# Patient Record
Sex: Male | Born: 1945 | Race: Black or African American | Hispanic: No | Marital: Single
Health system: Southern US, Community
[De-identification: ages and names within clinical notes are randomized; demographics above are authoritative.]

## PROBLEM LIST (undated history)

## (undated) DIAGNOSIS — I10 Essential (primary) hypertension: Secondary | ICD-10-CM

---

## 2004-08-11 ENCOUNTER — Emergency Department (HOSPITAL_COMMUNITY): Admission: EM | Admit: 2004-08-11 | Discharge: 2004-08-11 | Payer: Self-pay | Admitting: Emergency Medicine

## 2006-05-15 ENCOUNTER — Ambulatory Visit: Payer: Self-pay | Admitting: Family Medicine

## 2006-06-25 ENCOUNTER — Encounter: Payer: Self-pay | Admitting: Family Medicine

## 2006-06-25 LAB — CONVERTED CEMR LAB
ALT: 39 units/L (ref 0–53)
AST: 35 units/L (ref 0–37)
Alkaline Phosphatase: 73 units/L (ref 39–117)
Basophils Relative: 1 % (ref 0–1)
Bilirubin, Direct: 0.2 mg/dL (ref 0.0–0.3)
CO2: 22 meq/L (ref 19–32)
Eosinophils Relative: 5 % (ref 0–5)
Glucose, Bld: 104 mg/dL — ABNORMAL HIGH (ref 70–99)
HCT: 43 % (ref 39.0–52.0)
Hemoglobin: 14.2 g/dL (ref 13.0–17.0)
Indirect Bilirubin: 0.8 mg/dL (ref 0.0–0.9)
LDL Cholesterol: 134 mg/dL — ABNORMAL HIGH (ref 0–99)
Lymphocytes Relative: 46 % (ref 12–46)
MCHC: 33 g/dL (ref 30.0–36.0)
MCV: 93.7 fL (ref 78.0–100.0)
Neutro Abs: 1.6 10*3/uL — ABNORMAL LOW (ref 1.7–7.7)
Neutrophils Relative %: 30 % — ABNORMAL LOW (ref 43–77)
PSA: 0.81 ng/mL (ref 0.10–4.00)
Potassium: 4.2 meq/L (ref 3.5–5.3)
Sodium: 142 meq/L (ref 135–145)
TSH: 0.811 microintl units/mL (ref 0.350–5.50)
Total Bilirubin: 1 mg/dL (ref 0.3–1.2)
WBC: 5.2 10*3/uL (ref 4.0–10.5)

## 2006-06-26 ENCOUNTER — Ambulatory Visit: Payer: Self-pay | Admitting: Family Medicine

## 2006-12-23 ENCOUNTER — Ambulatory Visit (HOSPITAL_COMMUNITY): Admission: RE | Admit: 2006-12-23 | Discharge: 2006-12-23 | Payer: Self-pay | Admitting: Family Medicine

## 2006-12-23 ENCOUNTER — Ambulatory Visit: Payer: Self-pay | Admitting: Family Medicine

## 2007-10-06 ENCOUNTER — Emergency Department (HOSPITAL_COMMUNITY): Admission: EM | Admit: 2007-10-06 | Discharge: 2007-10-06 | Payer: Self-pay | Admitting: Emergency Medicine

## 2008-06-01 ENCOUNTER — Ambulatory Visit: Payer: Self-pay | Admitting: Family Medicine

## 2008-06-01 DIAGNOSIS — I10 Essential (primary) hypertension: Secondary | ICD-10-CM | POA: Insufficient documentation

## 2008-06-01 DIAGNOSIS — R42 Dizziness and giddiness: Secondary | ICD-10-CM

## 2008-06-01 DIAGNOSIS — H547 Unspecified visual loss: Secondary | ICD-10-CM | POA: Insufficient documentation

## 2008-06-01 DIAGNOSIS — R519 Headache, unspecified: Secondary | ICD-10-CM | POA: Insufficient documentation

## 2008-06-01 DIAGNOSIS — R51 Headache: Secondary | ICD-10-CM

## 2008-06-01 DIAGNOSIS — M549 Dorsalgia, unspecified: Secondary | ICD-10-CM | POA: Insufficient documentation

## 2008-11-14 IMAGING — CR DG CHEST 1V PORT
1 series · 1 of 1 positions shown · non-contrast
Comparison: 12/23/2006

CLINICAL DATA: Chest pain

PORTABLE CHEST - 1 VIEW

[view not recorded]
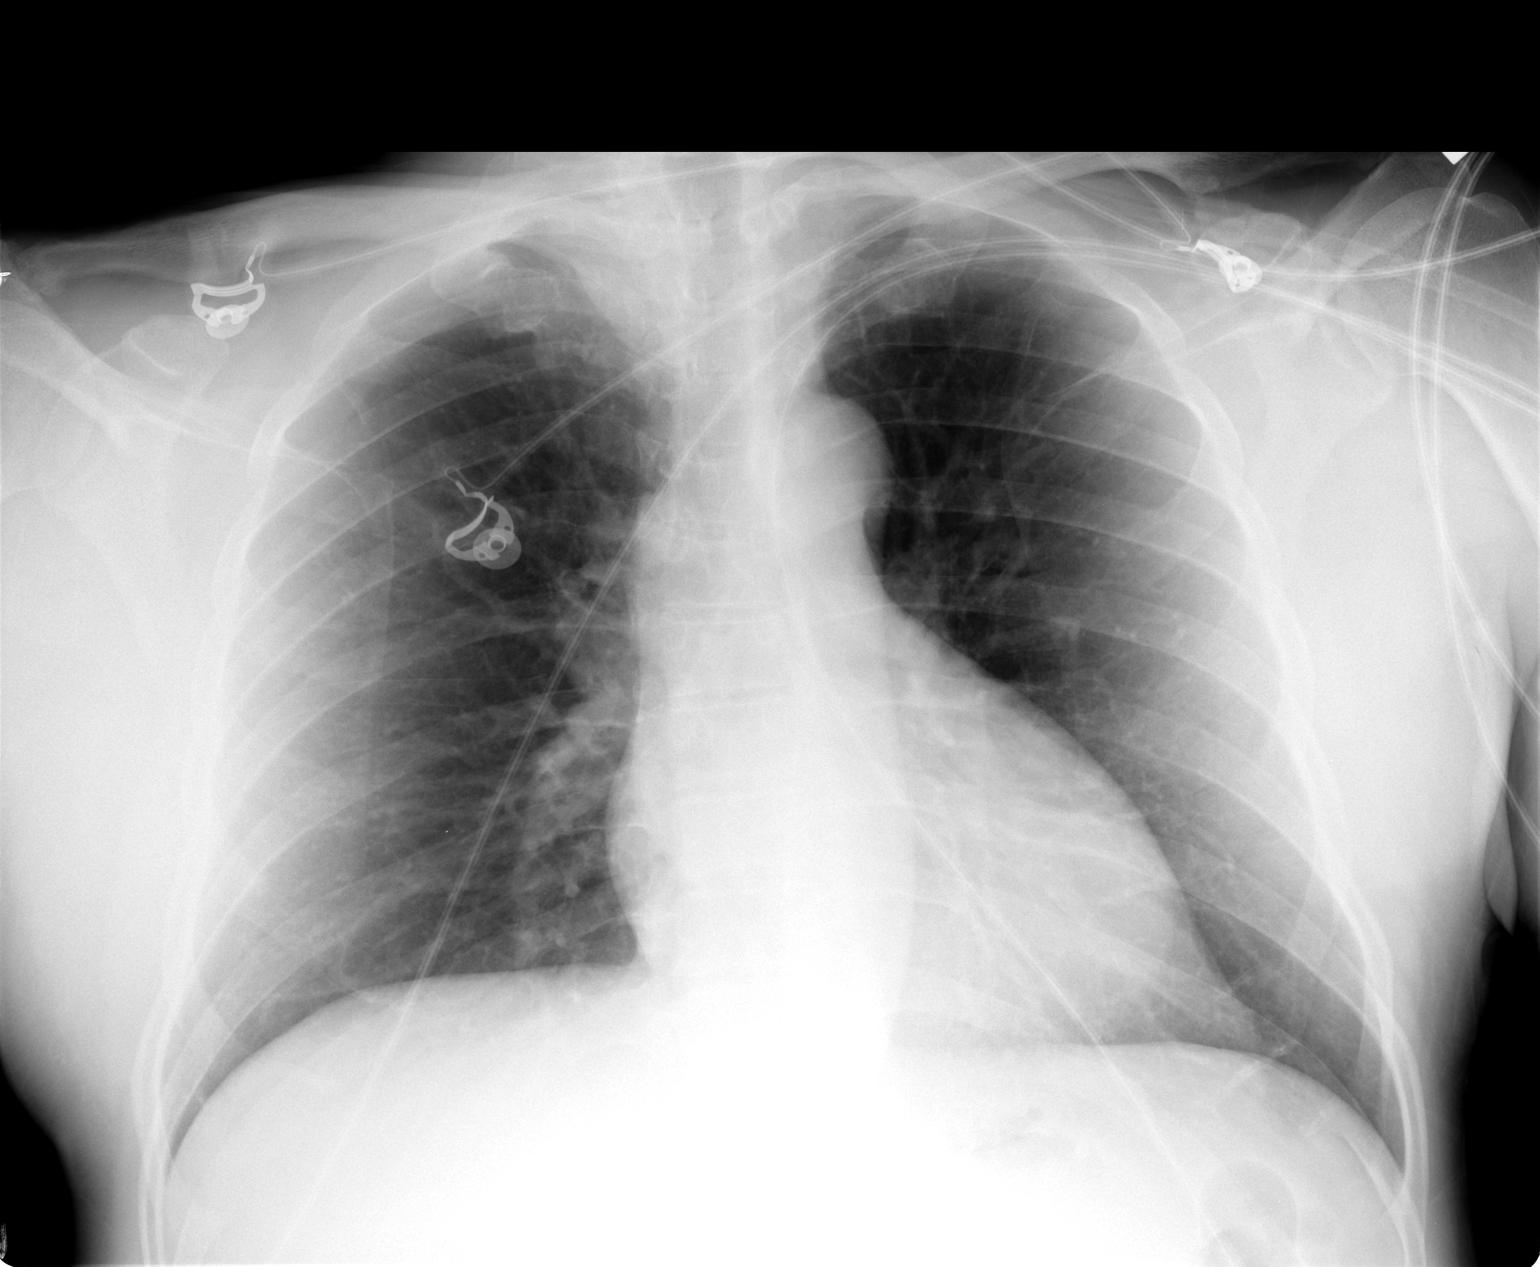

[1 of 1 positions shown; findings below may reference images not displayed]

FINDINGS: Heart and mediastinal contours are within normal limits.
No focal opacities or effusions.  No acute bony abnormality.
IMPRESSION: No active disease.

## 2008-12-22 ENCOUNTER — Ambulatory Visit: Payer: Self-pay | Admitting: Family Medicine

## 2008-12-23 ENCOUNTER — Encounter: Payer: Self-pay | Admitting: Family Medicine

## 2008-12-23 LAB — CONVERTED CEMR LAB
CO2: 26 meq/L (ref 19–32)
Chloride: 104 meq/L (ref 96–112)
Cholesterol: 223 mg/dL — ABNORMAL HIGH (ref 0–200)
HDL: 50 mg/dL (ref >39)
Total CHOL/HDL Ratio: 4.5
Triglycerides: 133 mg/dL (ref <150)
VLDL: 27 mg/dL (ref 0–40)

## 2008-12-26 LAB — CONVERTED CEMR LAB
Albumin: 4.5 g/dL (ref 3.5–5.2)
Bilirubin, Direct: 0.1 mg/dL (ref 0.0–0.3)
Total Protein: 7.6 g/dL (ref 6.0–8.3)

## 2008-12-29 ENCOUNTER — Encounter: Payer: Self-pay | Admitting: Family Medicine

## 2010-08-17 NOTE — H&P (Signed)
NAME:  Brad Kirk, Brad Kirk NO.:  [id number]   MEDICAL RECORD NO.:  [id number]          PATIENT TYPE:  AMB   LOCATION:  DAY                           FACILITY:  [facility]   PHYSICIAN:  Dalia Heading, M.D.  DATE OF BIRTH:  [birth year]   DATE OF ADMISSION:  DATE OF DISCHARGE:  LH                              HISTORY & PHYSICAL   CHIEF COMPLAINT:  Need for screening colonoscopy.   HISTORY OF PRESENT ILLNESS:  The patient is a 65 year old black male who  is referred for endoscopic evaluation.  Needs colonoscopy for screening  purposes.  No abdominal pain, weight loss, nausea, vomiting, diarrhea,  constipation, or melena.  Hematochezia has been noted.  He has never had  a colonoscopy.  There is no family history of colon carcinoma.   PAST MEDICAL HISTORY:  Hypertension.   PAST SURGICAL HISTORY:  Unremarkable.   CURRENT MEDICATIONS:  Blood pressure pill.   ALLERGIES:  No known drug allergies.   REVIEW OF SYSTEMS:  Noncontributory.   PHYSICAL EXAMINATION:  GENERAL:  The patient is a well-developed and  well-nourished black male in no acute distress.  LUNGS:  Clear to auscultation with equal breath sounds bilaterally.  HEART:  Regular rate and rhythm without S3, S4, or murmurs.  ABDOMEN:  Soft, nontender, and nondistended.  No hepatosplenomegaly or  masses are noted.  RECTAL:  Deferred due to the procedure.   IMPRESSION:  Need for screening colonoscopy.   PLAN:  The patient is scheduled for colonoscopy on June 17, 2008.  The  risks and benefits of the procedure including bleeding and perforation  were fully explained to the patient, who gave informed consent.      Dalia Heading, M.D.  Electronically Signed     MAJ/MEDQ  D:  06/14/2008  T:  06/15/2008  Job:  [id number]   cc:   Milus Mallick. Lodema Hong, M.D.  Fax: [phone]

## 2010-08-17 NOTE — H&P (Signed)
NAME:  Brad Kirk, Brad Kirk NO.:  [id number]   MEDICAL RECORD NO.:  [id number]          PATIENT TYPE:  AMB   LOCATION:  DAY                           FACILITY:  [facility]   PHYSICIAN:  Dalia Heading, M.D.  DATE OF BIRTH:  23-Aug-1945   DATE OF ADMISSION:  DATE OF DISCHARGE:  LH                              HISTORY & PHYSICAL   CHIEF COMPLAINT:  Need for screening colonoscopy.   HISTORY OF PRESENT ILLNESS:  The patient is a 65 year old black male who  is referred for endoscopic evaluation.  Needs colonoscopy for screening  purposes.  No abdominal pain, weight loss, nausea, vomiting, diarrhea,  constipation, or melena.  Hematochezia has been noted.  He has never had  a colonoscopy.  There is no family history of colon carcinoma.   PAST MEDICAL HISTORY:  Hypertension.   PAST SURGICAL HISTORY:  Unremarkable.   CURRENT MEDICATIONS:  Blood pressure pill.   ALLERGIES:  No known drug allergies.   REVIEW OF SYSTEMS:  Noncontributory.   PHYSICAL EXAMINATION:  GENERAL:  The patient is a well-developed and  well-nourished black male in no acute distress.  LUNGS:  Clear to auscultation with equal breath sounds bilaterally.  HEART:  Regular rate and rhythm without S3, S4, or murmurs.  ABDOMEN:  Soft, nontender, and nondistended.  No hepatosplenomegaly or  masses are noted.  RECTAL:  Deferred due to the procedure.   IMPRESSION:  Need for screening colonoscopy.   PLAN:  The patient is scheduled for colonoscopy on June 17, 2008.  The  risks and benefits of the procedure including bleeding and perforation  were fully explained to the patient, who gave informed consent.      Dalia Heading, M.D.     MAJ/MEDQ  D:  06/14/2008  T:  06/15/2008  Job:  [id number]   cc:   Milus Mallick. Lodema Hong, M.D.  Fax: [phone]

## 2010-12-27 LAB — CBC
Hemoglobin: 12.1 — ABNORMAL LOW
Platelets: 195
RBC: 3.92 — ABNORMAL LOW
RDW: 12.6

## 2010-12-27 LAB — DIFFERENTIAL
Basophils Absolute: 0
Eosinophils Relative: 6 — ABNORMAL HIGH
Lymphocytes Relative: 33
Monocytes Absolute: 0.5
Neutrophils Relative %: 51

## 2010-12-27 LAB — BASIC METABOLIC PANEL
CO2: 28
Calcium: 8.5
Chloride: 111
GFR calc Af Amer: >60

## 2010-12-27 LAB — POCT CARDIAC MARKERS: Operator id: 103621

## 2011-05-01 ENCOUNTER — Encounter: Payer: Self-pay | Admitting: Family Medicine

## 2011-05-01 ENCOUNTER — Ambulatory Visit: Payer: Self-pay | Admitting: Family Medicine

## 2011-05-02 ENCOUNTER — Encounter: Payer: Self-pay | Admitting: Family Medicine

## 2019-05-19 ENCOUNTER — Ambulatory Visit: Payer: Medicare Other | Attending: Internal Medicine

## 2019-05-19 DIAGNOSIS — Z23 Encounter for immunization: Secondary | ICD-10-CM | POA: Insufficient documentation

## 2019-05-19 NOTE — Progress Notes (Signed)
   Covid-19 Vaccination Clinic  Name:  ZAKHI DUPRE    MRN: [id number] DOB: Mar 21, 1946  05/19/2019  Mr. Harnois was observed post Covid-19 immunization for 15 minutes without incidence. He was provided with Vaccine Information Sheet and instruction to access the V-Safe system.   Mr. Gartman was instructed to call 911 with any severe reactions post vaccine: Marland Kitchen Difficulty breathing  . Swelling of your face and throat  . A fast heartbeat  . A bad rash all over your body  . Dizziness and weakness    Immunizations Administered    Name Date Dose VIS Date Route   Moderna COVID-19 Vaccine 05/19/2019 11:57 AM 0.5 mL 03/02/2019 Intramuscular   Manufacturer: Moderna   Lot: [id number]   NDC: 76184-859-27

## 2019-06-16 ENCOUNTER — Ambulatory Visit: Payer: Medicare Other | Attending: Internal Medicine

## 2019-06-16 DIAGNOSIS — Z23 Encounter for immunization: Secondary | ICD-10-CM

## 2019-06-16 NOTE — Progress Notes (Signed)
   Covid-19 Vaccination Clinic  Name:  SPIROS GREENFELD    MRN: [id number] DOB: [birth year]  06/16/2019  Mr. Fells was observed post Covid-19 immunization for 15 minutes without incident. He was provided with Vaccine Information Sheet and instruction to access the V-Safe system.   Mr. Baillie was instructed to call 911 with any severe reactions post vaccine: Marland Kitchen Difficulty breathing  . Swelling of face and throat  . A fast heartbeat  . A bad rash all over body  . Dizziness and weakness   Immunizations Administered    Name Date Dose VIS Date Route   Moderna COVID-19 Vaccine 06/16/2019 11:47 AM 0.5 mL 03/02/2019 Intramuscular   Manufacturer: Moderna   Lot: [id number]   NDC: 99242-683-41

## 2023-11-12 ENCOUNTER — Observation Stay (HOSPITAL_COMMUNITY)
Admission: EM | Admit: 2023-11-12 | Discharge: 2023-11-13 | Disposition: A | Discharge status: Home / Self Care | Attending: Internal Medicine | Admitting: Internal Medicine

## 2023-11-12 ENCOUNTER — Emergency Department (HOSPITAL_COMMUNITY)

## 2023-11-12 ENCOUNTER — Other Ambulatory Visit: Payer: Self-pay

## 2023-11-12 ENCOUNTER — Encounter (HOSPITAL_COMMUNITY): Payer: Self-pay

## 2023-11-12 DIAGNOSIS — I10 Essential (primary) hypertension: Secondary | ICD-10-CM | POA: Diagnosis not present

## 2023-11-12 DIAGNOSIS — I6523 Occlusion and stenosis of bilateral carotid arteries: Secondary | ICD-10-CM | POA: Insufficient documentation

## 2023-11-12 DIAGNOSIS — N1831 Chronic kidney disease, stage 3a: Secondary | ICD-10-CM | POA: Diagnosis not present

## 2023-11-12 DIAGNOSIS — R2689 Other abnormalities of gait and mobility: Secondary | ICD-10-CM | POA: Insufficient documentation

## 2023-11-12 DIAGNOSIS — R2681 Unsteadiness on feet: Secondary | ICD-10-CM | POA: Diagnosis not present

## 2023-11-12 DIAGNOSIS — M6281 Muscle weakness (generalized): Secondary | ICD-10-CM | POA: Insufficient documentation

## 2023-11-12 DIAGNOSIS — I639 Cerebral infarction, unspecified: Principal | ICD-10-CM | POA: Diagnosis present

## 2023-11-12 DIAGNOSIS — K219 Gastro-esophageal reflux disease without esophagitis: Secondary | ICD-10-CM | POA: Diagnosis not present

## 2023-11-12 DIAGNOSIS — Z7902 Long term (current) use of antithrombotics/antiplatelets: Secondary | ICD-10-CM | POA: Diagnosis not present

## 2023-11-12 DIAGNOSIS — Z79899 Other long term (current) drug therapy: Secondary | ICD-10-CM | POA: Insufficient documentation

## 2023-11-12 DIAGNOSIS — E785 Hyperlipidemia, unspecified: Secondary | ICD-10-CM | POA: Insufficient documentation

## 2023-11-12 DIAGNOSIS — I129 Hypertensive chronic kidney disease with stage 1 through stage 4 chronic kidney disease, or unspecified chronic kidney disease: Secondary | ICD-10-CM | POA: Diagnosis not present

## 2023-11-12 DIAGNOSIS — I6389 Other cerebral infarction: Secondary | ICD-10-CM | POA: Diagnosis not present

## 2023-11-12 DIAGNOSIS — I340 Nonrheumatic mitral (valve) insufficiency: Secondary | ICD-10-CM | POA: Diagnosis not present

## 2023-11-12 DIAGNOSIS — Z7982 Long term (current) use of aspirin: Secondary | ICD-10-CM | POA: Diagnosis not present

## 2023-11-12 DIAGNOSIS — I63511 Cerebral infarction due to unspecified occlusion or stenosis of right middle cerebral artery: Secondary | ICD-10-CM | POA: Diagnosis not present

## 2023-11-12 DIAGNOSIS — R531 Weakness: Secondary | ICD-10-CM | POA: Diagnosis present

## 2023-11-12 HISTORY — DX: Essential (primary) hypertension: I10

## 2023-11-12 LAB — CBC
HCT: 39.2 % (ref 39.0–52.0)
Hemoglobin: 12.8 g/dL — ABNORMAL LOW (ref 13.0–17.0)
MCH: 31.9 pg (ref 26.0–34.0)
MCHC: 32.7 g/dL (ref 30.0–36.0)
MCV: 97.8 fL (ref 80.0–100.0)
Platelets: 209 K/uL (ref 150–400)
RBC: 4.01 MIL/uL — ABNORMAL LOW (ref 4.22–5.81)
RDW: 12.3 % (ref 11.5–15.5)
WBC: 7.2 K/uL (ref 4.0–10.5)
nRBC: 0 % (ref 0.0–0.2)

## 2023-11-12 LAB — ETHANOL: Alcohol, Ethyl (B): <15 mg/dL (ref <15)

## 2023-11-12 LAB — PROTIME-INR
INR: 1 (ref 0.8–1.2)
Prothrombin Time: 14.1 s (ref 11.4–15.2)

## 2023-11-12 LAB — COMPREHENSIVE METABOLIC PANEL WITH GFR
ALT: 13 U/L (ref 0–44)
AST: 21 U/L (ref 15–41)
Albumin: 4 g/dL (ref 3.5–5.0)
Alkaline Phosphatase: 78 U/L (ref 38–126)
Anion gap: 10 (ref 5–15)
BUN: 14 mg/dL (ref 8–23)
CO2: 24 mmol/L (ref 22–32)
Calcium: 9.3 mg/dL (ref 8.9–10.3)
Chloride: 107 mmol/L (ref 98–111)
Creatinine, Ser: 1.33 mg/dL — ABNORMAL HIGH (ref 0.61–1.24)
GFR, Estimated: 56 mL/min — ABNORMAL LOW (ref >60)
Glucose, Bld: 134 mg/dL — ABNORMAL HIGH (ref 70–99)
Potassium: 3.3 mmol/L — ABNORMAL LOW (ref 3.5–5.1)
Sodium: 141 mmol/L (ref 135–145)
Total Bilirubin: 0.6 mg/dL (ref 0.0–1.2)
Total Protein: 8 g/dL (ref 6.5–8.1)

## 2023-11-12 LAB — DIFFERENTIAL
Abs Immature Granulocytes: 0.02 K/uL (ref 0.00–0.07)
Basophils Absolute: 0 K/uL (ref 0.0–0.1)
Basophils Relative: 0 %
Eosinophils Absolute: 0.1 K/uL (ref 0.0–0.5)
Eosinophils Relative: 2 %
Immature Granulocytes: 0 %
Lymphocytes Relative: 31 %
Lymphs Abs: 2.2 K/uL (ref 0.7–4.0)
Monocytes Absolute: 0.5 K/uL (ref 0.1–1.0)
Monocytes Relative: 6 %
Neutro Abs: 4.4 K/uL (ref 1.7–7.7)
Neutrophils Relative %: 61 %

## 2023-11-12 LAB — CBG MONITORING, ED: Glucose-Capillary: 143 mg/dL — ABNORMAL HIGH (ref 70–99)

## 2023-11-12 LAB — APTT: aPTT: 25 s (ref 24–36)

## 2023-11-12 MED ORDER — ASPIRIN 81 MG PO CHEW
324.0000 mg | CHEWABLE_TABLET | Freq: Once | ORAL | Status: AC
  Administered 2023-11-12 (×2): 324 mg via ORAL
  Filled 2023-11-12: qty 4

## 2023-11-12 MED ORDER — IOHEXOL 350 MG/ML SOLN
100.0000 mL | Freq: Once | INTRAVENOUS | Status: AC | PRN
  Administered 2023-11-12 (×2): 100 mL via INTRAVENOUS

## 2023-11-12 NOTE — ED Provider Notes (Signed)
 [place] EMERGENCY DEPARTMENT AT [facility] Provider Note   CSN: [id number] Arrival date & time: 11/12/23  2200  An emergency department physician performed an initial assessment on this suspected stroke patient at 2229.  Patient presents with: Weakness   Brad Kirk is a 78 y.o. male.    Weakness  Patient is a 78 year old male with a history of untreated hypertension although has been diagnosed with it in the past.  He does not have diabetes, no history of cardiac disease or prior stroke.  He states that he went to sit down on the couch at 7:00 tonight to watch TV and had no complaints.  Initially he reported that about 30 minutes ago he noticed that his left arm started tingling and did not feel right.  He could not use it.  When I ask him about the timing he states will maybe it was an hour and a half ago.  He definitely thinks that it was normal at 7:00.  He denies headache, chest pain, shortness of breath or any other complaints.  He is noted to be severely hypertensive on arrival at 180/87.    Prior to Admission medications   Not on File    Allergies: Patient has no known allergies.    Review of Systems  Neurological:  Positive for weakness.  All other systems reviewed and are negative.   Updated Vital Signs BP (!) 172/86   Pulse 78   Temp 98.3 F (36.8 C) (Oral)   Resp 18   Ht 1.778 m (5' 10)   Wt 81.6 kg   SpO2 95%   BMI 25.83 kg/m   Physical Exam Vitals and nursing note reviewed.  Constitutional:      General: He is not in acute distress.    Appearance: He is well-developed.  HENT:     Head: Normocephalic and atraumatic.     Mouth/Throat:     Pharynx: No oropharyngeal exudate.  Eyes:     General: No scleral icterus.       Right eye: No discharge.        Left eye: No discharge.     Conjunctiva/sclera: Conjunctivae normal.     Pupils: Pupils are equal, round, and reactive to light.  Neck:     Thyroid: No thyromegaly.     Vascular:  No JVD.  Cardiovascular:     Rate and Rhythm: Normal rate and regular rhythm.     Heart sounds: Normal heart sounds. No murmur heard.    No friction rub. No gallop.  Pulmonary:     Effort: Pulmonary effort is normal. No respiratory distress.     Breath sounds: Normal breath sounds. No wheezing or rales.  Abdominal:     General: Bowel sounds are normal. There is no distension.     Palpations: Abdomen is soft. There is no mass.     Tenderness: There is no abdominal tenderness.  Musculoskeletal:        General: No tenderness. Normal range of motion.     Cervical back: Normal range of motion and neck supple.  Lymphadenopathy:     Cervical: No cervical adenopathy.  Skin:    General: Skin is warm and dry.     Findings: No erythema or rash.  Neurological:     Mental Status: He is alert.     Coordination: Coordination normal.     Comments: The patient has no facial asymmetry, his speech is normal, unfortunately he has fairly dense weakness of  his left upper extremity, he is able to barely lift the arm off the bed, he has dysmetria has no grip and has decree sensation of the left arm, his left leg has decree sensation and he has weakness of the left leg compared to the right.  Normal gross visual acuity, cranial nerves III through XII otherwise normal  Psychiatric:        Behavior: Behavior normal.     (all labs ordered are listed, but only abnormal results are displayed) Labs Reviewed  CBC - Abnormal; Notable for the following components:      Result Value   RBC 4.01 (*)    Hemoglobin 12.8 (*)    All other components within normal limits  COMPREHENSIVE METABOLIC PANEL WITH GFR - Abnormal; Notable for the following components:   Potassium 3.3 (*)    Glucose, Bld 134 (*)    Creatinine, Ser 1.33 (*)    GFR, Estimated 56 (*)    All other components within normal limits  CBG MONITORING, ED - Abnormal; Notable for the following components:   Glucose-Capillary 143 (*)    All other  components within normal limits  ETHANOL  PROTIME-INR  APTT  DIFFERENTIAL  RAPID URINE DRUG SCREEN, HOSP PERFORMED  I-STAT CHEM 8, ED    EKG: EKG Interpretation Date/Time:  Wednesday November 12 2023 22:26:52 EDT Ventricular Rate:  84 PR Interval:  150 QRS Duration:  107 QT Interval:  385 QTC Calculation: 456 R Axis:   19  Text Interpretation: Sinus rhythm Borderline repolarization abnormality Confirmed by Cleotilde Rogue ([id number]) on 11/12/2023 11:07:33 PM  Radiology: CT HEAD CODE STROKE WO CONTRAST Result Date: 11/12/2023 CLINICAL DATA:  Initial evaluation for acute stroke. EXAM: CT HEAD WITHOUT CONTRAST CT ANGIOGRAPHY HEAD AND NECK CT PERFUSION BRAIN TECHNIQUE: Multidetector CT imaging of the head and neck was performed using the standard protocol during bolus administration of intravenous contrast. Multiplanar CT image reconstructions and MIPs were obtained to evaluate the vascular anatomy. Carotid stenosis measurements (when applicable) are obtained utilizing NASCET criteria, using the distal internal carotid diameter as the denominator. Multiphase CT imaging of the brain was performed following IV bolus contrast injection. Subsequent parametric perfusion maps were calculated using RAPID software. RADIATION DOSE REDUCTION: This exam was performed according to the departmental dose-optimization program which includes automated exposure control, adjustment of the mA and/or kV according to patient size and/or use of iterative reconstruction technique. CONTRAST:  OMNIPAQUE  IOHEXOL  350 MG/ML SOLN COMPARISON:  Prior study from 08/12/2004 FINDINGS: CT HEAD FINDINGS Brain: Cerebral volume within normal limits for age. Subtle loss of gray-white matter differentiation seen involving the high right frontal parietal region, concerning for an evolving acute right MCA territory infarct. No acute intracranial hemorrhage. No other acute large vessel territory infarct. No mass lesion or midline shift. No  hydrocephalus or extra-axial fluid collection. Vascular: No abnormal hyperdense vessel. Calcified atherosclerosis present about the carotid siphons. Skull: Scalp soft tissues demonstrate no acute finding. Calvarium intact. Sinuses/Orbits: Globes orbital soft tissues within normal limits. Paranasal sinuses and mastoid air cells are largely clear. Other: None. ASPECTS (Alberta Stroke Program Early CT Score) - Ganglionic level infarction (caudate, lentiform nuclei, internal capsule, insula, M1-M3 cortex): 7 - Supraganglionic infarction (M4-M6 cortex): 1 Total score (0-10 with 10 being normal): 8 Review of the MIP images confirms the above findings CTA NECK FINDINGS Aortic arch: Standard branching. Imaged portion shows no evidence of aneurysm or dissection. No significant stenosis of the major arch vessel origins. Right carotid system: No  evidence of dissection, stenosis (50% or greater) or occlusion. Left carotid system: No evidence of dissection, stenosis (50% or greater) or occlusion. Vertebral arteries: No evidence of dissection, stenosis (50% or greater) or occlusion. Skeleton: No worrisome osseous lesions. Moderate spondylosis at C6-7 and C7-T1. Other neck: No other acute finding. Upper chest: No other acute finding. Review of the MIP images confirms the above findings CTA HEAD FINDINGS Anterior circulation: Atheromatous irregularity about the carotid siphons bilaterally. Resultant moderate stenosis at the supraclinoid left ICA. On the right, calcified plaque with superimposed intraluminal filling defect present at the right ICA terminus, suspicious for thrombus (series 6, image 103). Resultant severe high-grade stenosis at this level. Underlying atheromatous plaque at this location is likely contributory. A1 segments patent bilaterally. Normal anterior communicating artery complex. Anterior cerebral arteries patent without significant stenosis. No M1 stenosis or occlusion. No proximal MCA branch occlusion. Distal  MCA branches perfused and symmetric. No visible downstream complication. Posterior circulation: Atheromatous change about the dominant right V4 segment without stenosis. Left V4 segment patent. Neither PICA origin well visualized. Basilar patent without stenosis. Superior cerebellar and posterior cerebral arteries patent bilaterally. Venous sinuses: Patent allowing for timing the contrast bolus. Anatomic variants: None significant.  No aneurysm. Review of the MIP images confirms the above findings CT Brain Perfusion Findings: ASPECTS: 8 CBF (<30%) Volume: 4mL Perfusion (Tmax>6.0s) volume: 5mL Mismatch Volume: 1mL Infarction Location:Acute core infarct present at the right parietal convexity, consistent with an acute right MCA territory infarct. No significant surrounding ischemic penumbra by CT perfusion. IMPRESSION: CT HEAD: 1. Subtle loss of gray-white matter differentiation involving the high right frontoparietal region, concerning for an evolving acute right MCA territory infarct. No intracranial hemorrhage. 2. Aspects = 8. CTA HEAD AND NECK: 1. Intraluminal filling defect at the right ICA terminus, suspicious for thrombus. Resultant severe high-grade stenosis at this level. 2. Moderate atheromatous stenosis at the supraclinoid left ICA. 3. No hemodynamically significant stenosis within the neck. CT PERFUSION: 4 cc acute core infarct at the right parietal convexity, consistent with an acute right MCA territory infarct. No significant surrounding ischemic penumbra by CT perfusion. Critical Value/emergent results were called by telephone at the time of interpretation on 11/12/2023 at 10:45 p.m. to provider [facility] , who verbally acknowledged these results. Electronically Signed   By: Morene Hoard M.D.   On: 11/12/2023 23:15   CT ANGIO HEAD NECK W WO CM W PERF (CODE STROKE) Result Date: 11/12/2023 CLINICAL DATA:  Initial evaluation for acute stroke. EXAM: CT HEAD WITHOUT CONTRAST CT ANGIOGRAPHY HEAD  AND NECK CT PERFUSION BRAIN TECHNIQUE: Multidetector CT imaging of the head and neck was performed using the standard protocol during bolus administration of intravenous contrast. Multiplanar CT image reconstructions and MIPs were obtained to evaluate the vascular anatomy. Carotid stenosis measurements (when applicable) are obtained utilizing NASCET criteria, using the distal internal carotid diameter as the denominator. Multiphase CT imaging of the brain was performed following IV bolus contrast injection. Subsequent parametric perfusion maps were calculated using RAPID software. RADIATION DOSE REDUCTION: This exam was performed according to the departmental dose-optimization program which includes automated exposure control, adjustment of the mA and/or kV according to patient size and/or use of iterative reconstruction technique. CONTRAST:  OMNIPAQUE  IOHEXOL  350 MG/ML SOLN COMPARISON:  Prior study from 08/12/2004 FINDINGS: CT HEAD FINDINGS Brain: Cerebral volume within normal limits for age. Subtle loss of gray-white matter differentiation seen involving the high right frontal parietal region, concerning for an evolving acute right MCA territory infarct.  No acute intracranial hemorrhage. No other acute large vessel territory infarct. No mass lesion or midline shift. No hydrocephalus or extra-axial fluid collection. Vascular: No abnormal hyperdense vessel. Calcified atherosclerosis present about the carotid siphons. Skull: Scalp soft tissues demonstrate no acute finding. Calvarium intact. Sinuses/Orbits: Globes orbital soft tissues within normal limits. Paranasal sinuses and mastoid air cells are largely clear. Other: None. ASPECTS ([place] Stroke Program Early CT Score) - Ganglionic level infarction (caudate, lentiform nuclei, internal capsule, insula, M1-M3 cortex): 7 - Supraganglionic infarction (M4-M6 cortex): 1 Total score (0-10 with 10 being normal): 8 Review of the MIP images confirms the above  findings CTA NECK FINDINGS Aortic arch: Standard branching. Imaged portion shows no evidence of aneurysm or dissection. No significant stenosis of the major arch vessel origins. Right carotid system: No evidence of dissection, stenosis (50% or greater) or occlusion. Left carotid system: No evidence of dissection, stenosis (50% or greater) or occlusion. Vertebral arteries: No evidence of dissection, stenosis (50% or greater) or occlusion. Skeleton: No worrisome osseous lesions. Moderate spondylosis at C6-7 and C7-T1. Other neck: No other acute finding. Upper chest: No other acute finding. Review of the MIP images confirms the above findings CTA HEAD FINDINGS Anterior circulation: Atheromatous irregularity about the carotid siphons bilaterally. Resultant moderate stenosis at the supraclinoid left ICA. On the right, calcified plaque with superimposed intraluminal filling defect present at the right ICA terminus, suspicious for thrombus (series 6, image 103). Resultant severe high-grade stenosis at this level. Underlying atheromatous plaque at this location is likely contributory. A1 segments patent bilaterally. Normal anterior communicating artery complex. Anterior cerebral arteries patent without significant stenosis. No M1 stenosis or occlusion. No proximal MCA branch occlusion. Distal MCA branches perfused and symmetric. No visible downstream complication. Posterior circulation: Atheromatous change about the dominant right V4 segment without stenosis. Left V4 segment patent. Neither PICA origin well visualized. Basilar patent without stenosis. Superior cerebellar and posterior cerebral arteries patent bilaterally. Venous sinuses: Patent allowing for timing the contrast bolus. Anatomic variants: None significant.  No aneurysm. Review of the MIP images confirms the above findings CT Brain Perfusion Findings: ASPECTS: 8 CBF (<30%) Volume: 4mL Perfusion (Tmax>6.0s) volume: 5mL Mismatch Volume: 1mL Infarction  Location:Acute core infarct present at the right parietal convexity, consistent with an acute right MCA territory infarct. No significant surrounding ischemic penumbra by CT perfusion. IMPRESSION: CT HEAD: 1. Subtle loss of gray-white matter differentiation involving the high right frontoparietal region, concerning for an evolving acute right MCA territory infarct. No intracranial hemorrhage. 2. Aspects = 8. CTA HEAD AND NECK: 1. Intraluminal filling defect at the right ICA terminus, suspicious for thrombus. Resultant severe high-grade stenosis at this level. 2. Moderate atheromatous stenosis at the supraclinoid left ICA. 3. No hemodynamically significant stenosis within the neck. CT PERFUSION: 4 cc acute core infarct at the right parietal convexity, consistent with an acute right MCA territory infarct. No significant surrounding ischemic penumbra by CT perfusion. Critical Value/emergent results were called by telephone at the time of interpretation on 11/12/2023 at 10:45 p.m. to provider [facility] , who verbally acknowledged these results. Electronically Signed   By: Morene Hoard M.D.   On: 11/12/2023 23:15     .Critical Care  Performed by: Cleotilde Rogue, MD Authorized by: Cleotilde Rogue, MD   Critical care provider statement:    Critical care time (minutes):  45   Critical care time was exclusive of:  Separately billable procedures and treating other patients and teaching time   Critical care was necessary to treat  or prevent imminent or life-threatening deterioration of the following conditions:  CNS failure or compromise   Critical care was time spent personally by me on the following activities:  Development of treatment plan with patient or surrogate, discussions with consultants, evaluation of patient's response to treatment, examination of patient, obtaining history from patient or surrogate, review of old charts, re-evaluation of patient's condition, pulse oximetry, ordering and review  of radiographic studies, ordering and review of laboratory studies and ordering and performing treatments and interventions   I assumed direction of critical care for this patient from another provider in my specialty: no     Care discussed with: admitting provider   Comments:          Medications Ordered in the ED  aspirin  chewable tablet 324 mg (has no administration in time range)  iohexol  (OMNIPAQUE ) 350 MG/ML injection 100 mL (100 mLs Intravenous Contrast Given 11/12/23 2234)                                    Medical Decision Making Amount and/or Complexity of Data Reviewed Labs: ordered. Radiology: ordered.  Risk Prescription drug management. Decision regarding hospitalization.   Activated code stroke at 10:27 PM immediately while evaluating the patient   This patient presents to the ED for concern of acute code stroke, this involves an extensive number of treatment options, and is a complaint that carries with it a high risk of complications and morbidity.  The differential diagnosis includes stroke, hemorrhage, hypoglycemia, toxic metabolic, unlikely to be peripheral neuropathy given arm and leg involvement   Co morbidities / Chronic conditions that complicate the patient evaluation  Untreated hypertension   Additional history obtained:  Additional history obtained from EMR External records from outside source obtained and reviewed including medical record, patient has had no recent evaluations in the hospital, no recent admissions to the hospital, prior history of neuroimaging back in 2006 because of dizziness, no acute findings on that CT scan   Lab Tests:  I Ordered, and personally interpreted labs.  The pertinent results include: Potassium of 3.3, creatinine of 1.33, CBC alcohol INR on remarkable   Imaging Studies ordered:  I ordered imaging studies including CT scan of the brain as well as CT angiograms I independently visualized and interpreted imaging  which showed signs of ischemia with completed infarct, occlusive disease in MCA territory but nothing amenable to thrombectomy I agree with the radiologist interpretation   Cardiac Monitoring: / EKG:  The patient was maintained on a cardiac monitor.  I personally viewed and interpreted the cardiac monitored which showed an underlying rhythm of: Normal sinus rhythm   Problem List / ED Course / Critical interventions / Medication management  The patient is fairly hypertensive at 172/86, noncompliant with his medications.  Passive hypertension recommended by neurology Neurology was consulted immediately when I activated the code stroke.  The patient's story of timing evolved now stating that he had been having symptoms since about 2:00 PM and then even then told somebody else maybe they were coming and going over the last couple of days. I have reviewed the patients home medicines and have made adjustments as needed    Consultations Obtained:  I requested consultation with the neurology,  and discussed lab and imaging findings as well as pertinent plan - they recommend: Supportive care with aspirin  admission and the rest of the stroke workup given that his symptoms have  essentially showed a completed infarct not amenable to thrombectomy or TNK based on neurology recommendations Will d/w hospitalist for admission   Social Determinants of Health:  Noncompliant with medications   Test / Admission - Considered:  Admit to hospital, acutely ill with acute ischemic stroke and hypertensive complications      Final diagnoses:  Acute ischemic stroke [facility])      Cleotilde Rogue, MD 11/12/23 2320

## 2023-11-12 NOTE — Consult Note (Signed)
 [facility] Consult Services   Patient Name:   Brad Kirk, Brad Kirk Date of Birth:   Oct 10, 1945 Identification Number:   MRN - [id number] Date of Service:   11/12/2023 22:29:12  Diagnosis:       I63.89 - Cerebrovascular accident (CVA) due to other mechanism [facility])  Impression:      Patient presented with left sided weakness. Head CT shows concern of possible embolic infarct on the right frontal. No thrombolytic since patient is outside of treatment CTP confirms subacute stroke with just small infarct of 5cc.   Our recommendations are outlined below.  Recommendations:        Stroke/Telemetry Floor       Neuro Checks       Bedside Swallow Eval       DVT Prophylaxis       IV Fluids, Normal Saline       Head of Bed 30 Degrees       Euglycemia and Avoid Hyperthermia (PRN Acetaminophen)       Initiate or continue Aspirin  81 MG daily       Antihypertensives PRN if Blood pressure is greater than 220/120 or there is a concern for End organ damage/contraindications for permissive HTN. If blood pressure is greater than 220/120 give labetalol PO or IV or Vasotec IV with a goal of 15% reduction in BP during the first 24 hours.  Sign Out:       Discussed with Emergency Department Provider    ------------------------------------------------------------------------------  Advanced Imaging: CTA Head and Neck Completed.  LVO:No  Patient is not a candidate for NIR   Metrics: Last Known Well: 11/12/2023 14:30:00 Dispatch Time: 11/12/2023 22:29:11 Arrival Time: 11/12/2023 22:00:00 Initial Response Time: 11/12/2023 22:33:45 Symptoms: left sided weakness. Initial patient interaction: 11/12/2023 22:47:41 NIHSS Assessment Completed: 11/12/2023 22:53:22 Patient is not a candidate for Thrombolytic. Thrombolytic Medical Decision: 11/12/2023 22:53:23 Patient was not deemed candidate for Thrombolytic because of following reasons: LKW outside 4.5 hr window.  .  CT Head: I personally reviewed all the CT images that were available to me and it showed: right frontal small embolic stroke.  Primary Provider Notified of Diagnostic Impression and Management Plan on: 11/12/2023 22:55:05    ------------------------------------------------------------------------------  History of Present Illness: Patient is a 78 year old Male.  Patient was brought by private transportation with symptoms of left sided weakness. presented with left sided weakness. History was provided by the patient at bedside. Last seen normal was reported to be around 1430 while sitting and watching TV. Patient suddenly felt strange on the left side and later found out he was weak on the left.    Past Medical History:      There is no history of Stroke  Medications:  No Anticoagulant use  No Antiplatelet use Reviewed EMR for current medications  Allergies:  Reviewed  Social History: Smoking: No  Family History:  There is no family history of premature cerebrovascular disease pertinent to this consultation  ROS : 14 Points Review of Systems was performed and was negative except mentioned in HPI.  Past Surgical History: There Is No Surgical History Contributory To Today's Visit     Examination: BP(172/86), Pulse(88), Blood Glucose(143) 1A: Level of Consciousness - Alert; keenly responsive + 0 1B: Ask Month and Age - Both Questions Right + 0 1C: Blink Eyes & Squeeze Hands - Performs Both Tasks + 0 2: Test Horizontal Extraocular Movements - Normal + 0 3: Test Visual Fields - No Visual Loss + 0 4: Test  Facial Palsy (Use Grimace if Obtunded) - Normal symmetry + 0 5A: Test Left Arm Motor Drift - Drift, hits bed + 2 5B: Test Right Arm Motor Drift - No Drift for 10 Seconds + 0 6A: Test Left Leg Motor Drift - Drift, but doesn't hit bed + 1 6B: Test Right Leg Motor Drift - No Drift for 5 Seconds + 0 7: Test Limb Ataxia (FNF/Heel-Shin) - No Ataxia + 0 8: Test  Sensation - Normal; No sensory loss + 0 9: Test Language/Aphasia - Normal; No aphasia + 0 10: Test Dysarthria - Mild-Moderate Dysarthria: Slurring but can be understood + 1 11: Test Extinction/Inattention - No abnormality + 0  NIHSS Score: 4   Pre-Morbid Modified Rankin Scale: 0 Points = No symptoms at all  Spoke with : Dr Cleotilde I reviewed the available imaging via Rapid and initiated discussion with the primary provider  This consult was conducted in real time using interactive audio and video technology. Patient was informed of the technology being used for this visit and agreed to proceed. Patient located in hospital and provider located at home/office setting.   Patient is being evaluated for possible acute neurologic impairment and high probability of imminent or life-threatening deterioration. I spent total of 30 minutes providing care to this patient, including time for face to face visit via telemedicine, review of medical records, imaging studies and discussion of findings with providers, the patient and/or family. Radiologist not available: B: Remote physician workstations do not possess the same resolution, calibration, or diagnostic capabilities as hospital-based radiology reading stations, and formal radiologist read is necessary. A formal radiology interpretation was not available at the time of this evaluation. C: Bedside team was informed of the limitations of remote imaging review and the absence of radiology confirmation at this time. Recommendation to obtain formal radiology read as soon as possible was made.    Dr Kerri Kovacik Mc-ONeil Yahsir Wickens   [facility] For Inpatient follow-up with [facility] physician please call [facility] at [phone]. As we are not an outpatient service for any post hospital discharge needs please contact the hospital for assistance. If you have any questions for the [facility] physicians or need to reconsult for clinical or diagnostic  changes please contact us  via [facility] at [phone].   Signature : Kadee Philyaw Mc-ONeil Chazz Philson

## 2023-11-12 NOTE — ED Triage Notes (Addendum)
 Pt reports that about 30 minutes prior to arrival his left arm and leg started to feel numb and weak.  Family last saw pt well at noon today.

## 2023-11-13 ENCOUNTER — Observation Stay (HOSPITAL_BASED_OUTPATIENT_CLINIC_OR_DEPARTMENT_OTHER)

## 2023-11-13 ENCOUNTER — Telehealth: Payer: Self-pay | Admitting: *Deleted

## 2023-11-13 ENCOUNTER — Observation Stay (HOSPITAL_COMMUNITY)

## 2023-11-13 ENCOUNTER — Other Ambulatory Visit: Payer: Self-pay

## 2023-11-13 ENCOUNTER — Encounter (HOSPITAL_COMMUNITY): Payer: Self-pay | Admitting: Family Medicine

## 2023-11-13 DIAGNOSIS — I6389 Other cerebral infarction: Secondary | ICD-10-CM

## 2023-11-13 DIAGNOSIS — R29703 NIHSS score 3: Secondary | ICD-10-CM | POA: Diagnosis not present

## 2023-11-13 DIAGNOSIS — I63511 Cerebral infarction due to unspecified occlusion or stenosis of right middle cerebral artery: Secondary | ICD-10-CM | POA: Diagnosis not present

## 2023-11-13 DIAGNOSIS — R233 Spontaneous ecchymoses: Secondary | ICD-10-CM

## 2023-11-13 DIAGNOSIS — I639 Cerebral infarction, unspecified: Secondary | ICD-10-CM | POA: Diagnosis not present

## 2023-11-13 DIAGNOSIS — N1831 Chronic kidney disease, stage 3a: Secondary | ICD-10-CM | POA: Diagnosis not present

## 2023-11-13 DIAGNOSIS — I10 Essential (primary) hypertension: Secondary | ICD-10-CM | POA: Diagnosis not present

## 2023-11-13 LAB — ECHOCARDIOGRAM COMPLETE
Area-P 1/2: 4.68 cm2
Calc EF: 63 %
Height: 70 in
S' Lateral: 3.2 cm
Single Plane A2C EF: 64.2 %
Single Plane A4C EF: 63.9 %
Weight: 2880 [oz_av]

## 2023-11-13 LAB — CBC
HCT: 33.7 % — ABNORMAL LOW (ref 39.0–52.0)
Hemoglobin: 10.9 g/dL — ABNORMAL LOW (ref 13.0–17.0)
MCH: 31.6 pg (ref 26.0–34.0)
MCHC: 32.3 g/dL (ref 30.0–36.0)
MCV: 97.7 fL (ref 80.0–100.0)
Platelets: 185 K/uL (ref 150–400)
RBC: 3.45 MIL/uL — ABNORMAL LOW (ref 4.22–5.81)
RDW: 12.5 % (ref 11.5–15.5)
WBC: 8.4 K/uL (ref 4.0–10.5)
nRBC: 0 % (ref 0.0–0.2)

## 2023-11-13 LAB — LIPID PANEL
Cholesterol: 167 mg/dL (ref 0–200)
HDL: 45 mg/dL (ref >40)
LDL Cholesterol: 110 mg/dL — ABNORMAL HIGH (ref 0–99)
Total CHOL/HDL Ratio: 3.7 ratio
Triglycerides: 59 mg/dL (ref <150)
VLDL: 12 mg/dL (ref 0–40)

## 2023-11-13 LAB — RAPID URINE DRUG SCREEN, HOSP PERFORMED
Amphetamines: NOT DETECTED
Barbiturates: NOT DETECTED
Benzodiazepines: NOT DETECTED
Cocaine: NOT DETECTED
Opiates: NOT DETECTED
Tetrahydrocannabinol: POSITIVE — AB

## 2023-11-13 LAB — HEMOGLOBIN A1C
Hgb A1c MFr Bld: 4.9 % (ref 4.8–5.6)
Mean Plasma Glucose: 94 mg/dL

## 2023-11-13 LAB — MAGNESIUM: Magnesium: 2 mg/dL (ref 1.7–2.4)

## 2023-11-13 MED ORDER — SENNOSIDES-DOCUSATE SODIUM 8.6-50 MG PO TABS: 1.0000 | ORAL_TABLET | Freq: Every evening | ORAL | Status: DC | PRN

## 2023-11-13 MED ORDER — ACETAMINOPHEN 650 MG RE SUPP: 650.0000 mg | RECTAL | Status: DC | PRN

## 2023-11-13 MED ORDER — ATORVASTATIN CALCIUM 40 MG PO TABS
40.0000 mg | ORAL_TABLET | Freq: Every day | ORAL | Status: DC
  Administered 2023-11-13: 40 mg via ORAL
  Filled 2023-11-13: qty 1

## 2023-11-13 MED ORDER — PANTOPRAZOLE SODIUM 40 MG PO TBEC: 40.0000 mg | DELAYED_RELEASE_TABLET | Freq: Every day | ORAL | 1 refills | Status: AC

## 2023-11-13 MED ORDER — LOSARTAN POTASSIUM 25 MG PO TABS
12.5000 mg | ORAL_TABLET | Freq: Every day | ORAL | 3 refills | Status: AC
Start: 2023-11-16 — End: ?

## 2023-11-13 MED ORDER — ENOXAPARIN SODIUM 40 MG/0.4ML IJ SOSY
40.0000 mg | PREFILLED_SYRINGE | INTRAMUSCULAR | Status: DC
  Administered 2023-11-13: 40 mg via SUBCUTANEOUS
  Filled 2023-11-13: qty 0.4

## 2023-11-13 MED ORDER — CLOPIDOGREL BISULFATE 75 MG PO TABS
75.0000 mg | ORAL_TABLET | Freq: Every day | ORAL | Status: DC
  Administered 2023-11-13: 75 mg via ORAL
  Filled 2023-11-13: qty 1

## 2023-11-13 MED ORDER — ASPIRIN 81 MG PO CHEW: 81.0000 mg | CHEWABLE_TABLET | Freq: Every day | ORAL | 11 refills | Status: AC

## 2023-11-13 MED ORDER — ACETAMINOPHEN 160 MG/5ML PO SOLN: 650.0000 mg | ORAL | Status: DC | PRN

## 2023-11-13 MED ORDER — POTASSIUM CHLORIDE CRYS ER 20 MEQ PO TBCR
20.0000 meq | EXTENDED_RELEASE_TABLET | Freq: Once | ORAL | Status: AC
  Administered 2023-11-13: 20 meq via ORAL
  Filled 2023-11-13: qty 1

## 2023-11-13 MED ORDER — ASPIRIN 81 MG PO CHEW
81.0000 mg | CHEWABLE_TABLET | Freq: Every day | ORAL | Status: DC
  Administered 2023-11-13: 81 mg via ORAL
  Filled 2023-11-13: qty 1

## 2023-11-13 MED ORDER — SODIUM CHLORIDE 0.9 % IV SOLN: INTRAVENOUS | Status: DC

## 2023-11-13 MED ORDER — CLOPIDOGREL BISULFATE 75 MG PO TABS: 75.0000 mg | ORAL_TABLET | Freq: Every day | ORAL | 0 refills | Status: DC

## 2023-11-13 MED ORDER — ACETAMINOPHEN 325 MG PO TABS: 650.0000 mg | ORAL_TABLET | Freq: Four times a day (QID) | ORAL | Status: DC | PRN

## 2023-11-13 MED ORDER — ACETAMINOPHEN 325 MG PO TABS: 650.0000 mg | ORAL_TABLET | ORAL | Status: DC | PRN

## 2023-11-13 MED ORDER — ACETAMINOPHEN 325 MG PO TABS
650.0000 mg | ORAL_TABLET | Freq: Four times a day (QID) | ORAL | Status: DC | PRN
  Administered 2023-11-13: 650 mg via ORAL
  Filled 2023-11-13: qty 2

## 2023-11-13 MED ORDER — STROKE: EARLY STAGES OF RECOVERY BOOK: Freq: Once | Status: DC

## 2023-11-13 MED ORDER — LABETALOL HCL 5 MG/ML IV SOLN: 10.0000 mg | INTRAVENOUS | Status: DC | PRN

## 2023-11-13 MED ORDER — ATORVASTATIN CALCIUM 40 MG PO TABS: 40.0000 mg | ORAL_TABLET | Freq: Every day | ORAL | 2 refills | Status: DC

## 2023-11-13 NOTE — Progress Notes (Signed)
 SLP Cancellation Note  Patient Details Name: Brad Kirk MRN: [id number] DOB: 1945-05-04   Cancelled treatment:       Reason Eval/Treat Not Completed: Patient at procedure or test/unavailable (ECHO in room, SLP will check back later for SLE)  Thank you,  Lamar Candy, CCC-SLP [phone]  Yenesis Even 11/13/2023, 1:46 PM

## 2023-11-13 NOTE — Plan of Care (Signed)
  Problem: Acute Rehab PT Goals(only PT should resolve) Goal: Patient Will Transfer Sit To/From Stand Outcome: Progressing Flowsheets (Taken 11/13/2023 1205) Patient will transfer sit to/from stand: Independently Goal: Pt Will Transfer Bed To Chair/Chair To Bed Outcome: Progressing Flowsheets (Taken 11/13/2023 1205) Pt will Transfer Bed to Chair/Chair to Bed:  with modified independence  Independently Goal: Pt Will Ambulate Outcome: Progressing Flowsheets (Taken 11/13/2023 1205) Pt will Ambulate:  with supervision  > 125 feet Note: With improved ability to turn for safe return to community ambulation

## 2023-11-13 NOTE — Telephone Encounter (Signed)
-----   Message from [facility] sent at 11/13/2023  4:53 PM EDT ----- Regarding: RE: Cardiac event monitoring Neurology recommended 30 days  Thanks ----- Message ----- From: Dominic Reynaldo MATSU, LPN Sent: [id number]   4:52 PM EDT To: Eric Nunnery, MD Subject: RE: Cardiac event monitoring                   Would you like a 30 day event or 14 day Zio patch? ----- Message ----- From: Nunnery Eric, MD Sent: 11/13/2023   3:53 PM EDT To: Reynaldo MATSU Rasul Decola, LPN Subject: Cardiac event monitoring                       Please arrange 14 days event monitoring as an outpatient, for patient with acute ischemic stroke as recommended by neurology service.  Thanks

## 2023-11-13 NOTE — Progress Notes (Signed)
  Echocardiogram 2D Echocardiogram has been performed.  Devora Ellouise SAUNDERS 11/13/2023, 2:13 PM

## 2023-11-13 NOTE — Plan of Care (Signed)
  Problem: Acute Rehab OT Goals (only OT should resolve) Goal: Pt. Will Perform Grooming Flowsheets (Taken 11/13/2023 1100) Pt Will Perform Grooming: Independently Goal: Pt. Will Transfer To Toilet Flowsheets (Taken 11/13/2023 1100) Pt Will Transfer to Toilet: Independently Goal: Pt/Caregiver Will Perform Home Exercise Program Flowsheets (Taken 11/13/2023 1100) Pt/caregiver will Perform Home Exercise Program:  Increased strength -increased fine and gross motor coordination  Left upper extremity  Independently  Shaili Donalson OT, MOT

## 2023-11-13 NOTE — Plan of Care (Signed)

## 2023-11-13 NOTE — H&P (Signed)
 History and Physical    Brad Kirk FMW:[id number] DOB: July 06, 1945 DOA: 11/12/2023  PCP: Patient, No Pcp Per   Patient coming from: Home   Chief Complaint: Left-sided weakness   HPI: Brad Kirk is a 78 y.o. male with medical history significant for hypertension, now presenting with left-sided weakness.  Patient reports that he was in his usual state and having an uneventful day until roughly 2:30 PM when he developed a strange sensation on his left side and found his left arm and leg to be weak.  He reports that he had similar symptoms a few days ago that resolved spontaneously.  Today, symptoms persisted.  He denies any associated chest pain or palpitations, recent fall or trauma, or recent fevers or chills.  States that he was diagnosed with hypertension previously but has not seen a physician or taken any medication in years.  ED Course: Upon arrival to the ED, patient is found to be afebrile and saturating mid 90s on room air with normal HR and elevated BP.  Labs are most notable for potassium 3.3, creatinine 1.33, normal WBC, and hemoglobin 12.8.  CT perfusion study is concerning for 4 cm acute infarct at the right parietal convexity without significant surrounding ischemic penumbra.  CTA reveals filling defect of the right ICA terminus suspicious for thrombus with resulting high-grade stenosis.  Teleneurology evaluated the patient in the ED and the patient was given 324 mg of aspirin .  Review of Systems:  All other systems reviewed and apart from HPI, are negative.  Past Medical History:  Diagnosis Date   Hypertension     History reviewed. No pertinent surgical history.  Social History:   reports that he has never smoked. He has never used smokeless tobacco. He reports that he does not currently use alcohol. He reports current drug use. Drug: Marijuana.  No Known Allergies  History reviewed. No pertinent family history.   Prior to Admission medications   Not  on File    Physical Exam: Vitals:   11/12/23 2217 11/12/23 2223 11/12/23 2315 11/13/23 0000  BP: (!) 180/87 (!) 172/86 (!) 157/91 (!) 182/94  Pulse: 88 78 75 63  Resp: 18  19   Temp: 98.3 F (36.8 C)   98.2 F (36.8 C)  TempSrc: Oral   Oral  SpO2: 98% 95% 95% 100%  Weight:  81.6 kg    Height:  5' 10 (1.778 m)      Constitutional: NAD, calm  Eyes: PERTLA, lids and conjunctivae normal ENMT: Mucous membranes are moist. Posterior pharynx clear of any exudate or lesions.   Neck: supple, no masses  Respiratory: no wheezing, no crackles. No accessory muscle use.  Cardiovascular: S1 & S2 heard, regular rate and rhythm. No extremity edema.   Abdomen: No tenderness, soft. Bowel sounds active.  Musculoskeletal: no clubbing / cyanosis. No joint deformity upper and lower extremities.   Skin: no significant rashes, lesions, ulcers. Warm, dry, well-perfused. Neurologic: CN 2-12 grossly intact. Sensation to light touch intact. Strength 5/5 throughout right side, 5/5 throughout LLE when encouraged, and 3-4/5 in LUE. Alert and oriented to person, place, and time.  Psychiatric: Pleasant. Cooperative.    Labs and Imaging on Admission: I have personally reviewed following labs and imaging studies  CBC: Recent Labs  Lab 11/12/23 2230  WBC 7.2  NEUTROABS 4.4  HGB 12.8*  HCT 39.2  MCV 97.8  PLT 209   Basic Metabolic Panel: Recent Labs  Lab 11/12/23 2230  NA 141  K  3.3*  CL 107  CO2 24  GLUCOSE 134*  BUN 14  CREATININE 1.33*  CALCIUM  9.3   GFR: Estimated Creatinine Clearance: 49.6 mL/min (A) (by C-G formula based on SCr of 1.33 mg/dL (H)). Liver Function Tests: Recent Labs  Lab 11/12/23 2230  AST 21  ALT 13  ALKPHOS 78  BILITOT 0.6  PROT 8.0  ALBUMIN 4.0   No results for input(s): LIPASE, AMYLASE in the last 168 hours. No results for input(s): AMMONIA in the last 168 hours. Coagulation Profile: Recent Labs  Lab 11/12/23 2230  INR 1.0   Cardiac Enzymes: No  results for input(s): CKTOTAL, CKMB, CKMBINDEX, TROPONINI in the last 168 hours. BNP (last 3 results) No results for input(s): PROBNP in the last 8760 hours. HbA1C: No results for input(s): HGBA1C in the last 72 hours. CBG: Recent Labs  Lab 11/12/23 2231  GLUCAP 143*   Lipid Profile: No results for input(s): CHOL, HDL, LDLCALC, TRIG, CHOLHDL, LDLDIRECT in the last 72 hours. Thyroid Function Tests: No results for input(s): TSH, T4TOTAL, FREET4, T3FREE, THYROIDAB in the last 72 hours. Anemia Panel: No results for input(s): VITAMINB12, FOLATE, FERRITIN, TIBC, IRON, RETICCTPCT in the last 72 hours. Urine analysis: No results found for: COLORURINE, APPEARANCEUR, LABSPEC, PHURINE, GLUCOSEU, HGBUR, BILIRUBINUR, KETONESUR, PROTEINUR, UROBILINOGEN, NITRITE, LEUKOCYTESUR Sepsis Labs: @LABRCNTIP (procalcitonin:4,lacticidven:4) )No results found for this or any previous visit (from the past 240 hours).   Radiological Exams on Admission: CT HEAD CODE STROKE WO CONTRAST Result Date: 11/12/2023 CLINICAL DATA:  Initial evaluation for acute stroke. EXAM: CT HEAD WITHOUT CONTRAST CT ANGIOGRAPHY HEAD AND NECK CT PERFUSION BRAIN TECHNIQUE: Multidetector CT imaging of the head and neck was performed using the standard protocol during bolus administration of intravenous contrast. Multiplanar CT image reconstructions and MIPs were obtained to evaluate the vascular anatomy. Carotid stenosis measurements (when applicable) are obtained utilizing NASCET criteria, using the distal internal carotid diameter as the denominator. Multiphase CT imaging of the brain was performed following IV bolus contrast injection. Subsequent parametric perfusion maps were calculated using RAPID software. RADIATION DOSE REDUCTION: This exam was performed according to the departmental dose-optimization program which includes automated exposure control, adjustment of the  mA and/or kV according to patient size and/or use of iterative reconstruction technique. CONTRAST:  OMNIPAQUE  IOHEXOL  350 MG/ML SOLN COMPARISON:  Prior study from 08/12/2004 FINDINGS: CT HEAD FINDINGS Brain: Cerebral volume within normal limits for age. Subtle loss of gray-white matter differentiation seen involving the high right frontal parietal region, concerning for an evolving acute right MCA territory infarct. No acute intracranial hemorrhage. No other acute large vessel territory infarct. No mass lesion or midline shift. No hydrocephalus or extra-axial fluid collection. Vascular: No abnormal hyperdense vessel. Calcified atherosclerosis present about the carotid siphons. Skull: Scalp soft tissues demonstrate no acute finding. Calvarium intact. Sinuses/Orbits: Globes orbital soft tissues within normal limits. Paranasal sinuses and mastoid air cells are largely clear. Other: None. ASPECTS (Alberta Stroke Program Early CT Score) - Ganglionic level infarction (caudate, lentiform nuclei, internal capsule, insula, M1-M3 cortex): 7 - Supraganglionic infarction (M4-M6 cortex): 1 Total score (0-10 with 10 being normal): 8 Review of the MIP images confirms the above findings CTA NECK FINDINGS Aortic arch: Standard branching. Imaged portion shows no evidence of aneurysm or dissection. No significant stenosis of the major arch vessel origins. Right carotid system: No evidence of dissection, stenosis (50% or greater) or occlusion. Left carotid system: No evidence of dissection, stenosis (50% or greater) or occlusion. Vertebral arteries: No evidence of dissection, stenosis (  50% or greater) or occlusion. Skeleton: No worrisome osseous lesions. Moderate spondylosis at C6-7 and C7-T1. Other neck: No other acute finding. Upper chest: No other acute finding. Review of the MIP images confirms the above findings CTA HEAD FINDINGS Anterior circulation: Atheromatous irregularity about the carotid siphons bilaterally. Resultant  moderate stenosis at the supraclinoid left ICA. On the right, calcified plaque with superimposed intraluminal filling defect present at the right ICA terminus, suspicious for thrombus (series 6, image 103). Resultant severe high-grade stenosis at this level. Underlying atheromatous plaque at this location is likely contributory. A1 segments patent bilaterally. Normal anterior communicating artery complex. Anterior cerebral arteries patent without significant stenosis. No M1 stenosis or occlusion. No proximal MCA branch occlusion. Distal MCA branches perfused and symmetric. No visible downstream complication. Posterior circulation: Atheromatous change about the dominant right V4 segment without stenosis. Left V4 segment patent. Neither PICA origin well visualized. Basilar patent without stenosis. Superior cerebellar and posterior cerebral arteries patent bilaterally. Venous sinuses: Patent allowing for timing the contrast bolus. Anatomic variants: None significant.  No aneurysm. Review of the MIP images confirms the above findings CT Brain Perfusion Findings: ASPECTS: 8 CBF (<30%) Volume: 4mL Perfusion (Tmax>6.0s) volume: 5mL Mismatch Volume: 1mL Infarction Location:Acute core infarct present at the right parietal convexity, consistent with an acute right MCA territory infarct. No significant surrounding ischemic penumbra by CT perfusion. IMPRESSION: CT HEAD: 1. Subtle loss of gray-white matter differentiation involving the high right frontoparietal region, concerning for an evolving acute right MCA territory infarct. No intracranial hemorrhage. 2. Aspects = 8. CTA HEAD AND NECK: 1. Intraluminal filling defect at the right ICA terminus, suspicious for thrombus. Resultant severe high-grade stenosis at this level. 2. Moderate atheromatous stenosis at the supraclinoid left ICA. 3. No hemodynamically significant stenosis within the neck. CT PERFUSION: 4 cc acute core infarct at the right parietal convexity, consistent  with an acute right MCA territory infarct. No significant surrounding ischemic penumbra by CT perfusion. Critical Value/emergent results were called by telephone at the time of interpretation on 11/12/2023 at 10:45 p.m. to provider [facility] , who verbally acknowledged these results. Electronically Signed   By: Morene Hoard M.D.   On: 11/12/2023 23:15   CT ANGIO HEAD NECK W WO CM W PERF (CODE STROKE) Result Date: 11/12/2023 CLINICAL DATA:  Initial evaluation for acute stroke. EXAM: CT HEAD WITHOUT CONTRAST CT ANGIOGRAPHY HEAD AND NECK CT PERFUSION BRAIN TECHNIQUE: Multidetector CT imaging of the head and neck was performed using the standard protocol during bolus administration of intravenous contrast. Multiplanar CT image reconstructions and MIPs were obtained to evaluate the vascular anatomy. Carotid stenosis measurements (when applicable) are obtained utilizing NASCET criteria, using the distal internal carotid diameter as the denominator. Multiphase CT imaging of the brain was performed following IV bolus contrast injection. Subsequent parametric perfusion maps were calculated using RAPID software. RADIATION DOSE REDUCTION: This exam was performed according to the departmental dose-optimization program which includes automated exposure control, adjustment of the mA and/or kV according to patient size and/or use of iterative reconstruction technique. CONTRAST:  OMNIPAQUE  IOHEXOL  350 MG/ML SOLN COMPARISON:  Prior study from 08/12/2004 FINDINGS: CT HEAD FINDINGS Brain: Cerebral volume within normal limits for age. Subtle loss of gray-white matter differentiation seen involving the high right frontal parietal region, concerning for an evolving acute right MCA territory infarct. No acute intracranial hemorrhage. No other acute large vessel territory infarct. No mass lesion or midline shift. No hydrocephalus or extra-axial fluid collection. Vascular: No abnormal hyperdense vessel. Calcified  atherosclerosis present about the carotid siphons. Skull: Scalp soft tissues demonstrate no acute finding. Calvarium intact. Sinuses/Orbits: Globes orbital soft tissues within normal limits. Paranasal sinuses and mastoid air cells are largely clear. Other: None. ASPECTS ([place] Stroke Program Early CT Score) - Ganglionic level infarction (caudate, lentiform nuclei, internal capsule, insula, M1-M3 cortex): 7 - Supraganglionic infarction (M4-M6 cortex): 1 Total score (0-10 with 10 being normal): 8 Review of the MIP images confirms the above findings CTA NECK FINDINGS Aortic arch: Standard branching. Imaged portion shows no evidence of aneurysm or dissection. No significant stenosis of the major arch vessel origins. Right carotid system: No evidence of dissection, stenosis (50% or greater) or occlusion. Left carotid system: No evidence of dissection, stenosis (50% or greater) or occlusion. Vertebral arteries: No evidence of dissection, stenosis (50% or greater) or occlusion. Skeleton: No worrisome osseous lesions. Moderate spondylosis at C6-7 and C7-T1. Other neck: No other acute finding. Upper chest: No other acute finding. Review of the MIP images confirms the above findings CTA HEAD FINDINGS Anterior circulation: Atheromatous irregularity about the carotid siphons bilaterally. Resultant moderate stenosis at the supraclinoid left ICA. On the right, calcified plaque with superimposed intraluminal filling defect present at the right ICA terminus, suspicious for thrombus (series 6, image 103). Resultant severe high-grade stenosis at this level. Underlying atheromatous plaque at this location is likely contributory. A1 segments patent bilaterally. Normal anterior communicating artery complex. Anterior cerebral arteries patent without significant stenosis. No M1 stenosis or occlusion. No proximal MCA branch occlusion. Distal MCA branches perfused and symmetric. No visible downstream complication. Posterior circulation:  Atheromatous change about the dominant right V4 segment without stenosis. Left V4 segment patent. Neither PICA origin well visualized. Basilar patent without stenosis. Superior cerebellar and posterior cerebral arteries patent bilaterally. Venous sinuses: Patent allowing for timing the contrast bolus. Anatomic variants: None significant.  No aneurysm. Review of the MIP images confirms the above findings CT Brain Perfusion Findings: ASPECTS: 8 CBF (<30%) Volume: 4mL Perfusion (Tmax>6.0s) volume: 5mL Mismatch Volume: 1mL Infarction Location:Acute core infarct present at the right parietal convexity, consistent with an acute right MCA territory infarct. No significant surrounding ischemic penumbra by CT perfusion. IMPRESSION: CT HEAD: 1. Subtle loss of gray-white matter differentiation involving the high right frontoparietal region, concerning for an evolving acute right MCA territory infarct. No intracranial hemorrhage. 2. Aspects = 8. CTA HEAD AND NECK: 1. Intraluminal filling defect at the right ICA terminus, suspicious for thrombus. Resultant severe high-grade stenosis at this level. 2. Moderate atheromatous stenosis at the supraclinoid left ICA. 3. No hemodynamically significant stenosis within the neck. CT PERFUSION: 4 cc acute core infarct at the right parietal convexity, consistent with an acute right MCA territory infarct. No significant surrounding ischemic penumbra by CT perfusion. Critical Value/emergent results were called by telephone at the time of interpretation on 11/12/2023 at 10:45 p.m. to provider [facility] , who verbally acknowledged these results. Electronically Signed   By: Morene Hoard M.D.   On: 11/12/2023 23:15    EKG: Independently reviewed. Sinus rhythm.   Assessment/Plan   1. Acute ischemic CVA  - Continue cardiac monitoring and neuro checks, check MRI brain, TTE, A1c, and lipids, consult PT/OT/SLP, continue ASA 81 mg daily, permit HTN up to 220/120 for now    2. CKD 3A   - Renally-dose medications  3. Hypertension  - Pt reports hx of HTN but has not seen a physician in years and dose not take any medications  - Permit HTN in acute-phase of ischemic  CVA     DVT prophylaxis: Lovenox   Code Status: Full  Level of Care: Level of care: Telemetry Family Communication: 2 sisters at bedside  Disposition Plan:  Patient is from: Home  Anticipated d/c is to: TBD Anticipated d/c date is: 8/14 or 11/14/23  Patient currently: Pending CVA workup  Consults called: Teleneurology  Admission status: Observation     Evalene GORMAN Sprinkles, MD [facility]  11/13/2023, 2:43 AM

## 2023-11-13 NOTE — Progress Notes (Signed)
   11/13/23 1526  TOC Brief Assessment  Insurance and Status Lapsed  Patient has primary care physician No  Home environment has been reviewed From home  Prior level of function: Assisted  Prior/Current Home Services No current home services  Social Drivers of Health Review SDOH reviewed no interventions necessary  Readmission risk has been reviewed Yes  Transition of care needs transition of care needs identified, TOC will continue to follow   [facility] referral made. Discussed the program c/pt and his daughter at the bedside, info card left c/daughter and info added to AVS. Also discussed the [facility] free clinic as another option for medical care. [facility] on behalf of the pt at the request of his family. The patient is currently not eligible for VA health benefits. VA contact information added to AVS if family wishes to explore further.   PT recommended outpatient PT. [facility] [place] does offer self-pay options/assistance. This information was relayed to pt and his daughter, referral made and AVS updated.

## 2023-11-13 NOTE — Plan of Care (Signed)
  Problem: Education: Goal: Knowledge of General Education information will improve Description: Including pain rating scale, medication(s)/side effects and non-pharmacologic comfort measures Outcome: Progressing   Problem: Health Behavior/Discharge Planning: Goal: Ability to manage health-related needs will improve Outcome: Progressing   Problem: Clinical Measurements: Goal: Ability to maintain clinical measurements within normal limits will improve Outcome: Progressing Goal: Will remain free from infection Outcome: Progressing Goal: Diagnostic test results will improve Outcome: Progressing Goal: Respiratory complications will improve Outcome: Progressing Goal: Cardiovascular complication will be avoided Outcome: Progressing   Problem: Nutrition: Goal: Adequate nutrition will be maintained Outcome: Progressing   Problem: Coping: Goal: Level of anxiety will decrease Outcome: Progressing   Problem: Elimination: Goal: Will not experience complications related to bowel motility Outcome: Progressing Goal: Will not experience complications related to urinary retention Outcome: Progressing   Problem: Safety: Goal: Ability to remain free from injury will improve Outcome: Progressing   Problem: Skin Integrity: Goal: Risk for impaired skin integrity will decrease Outcome: Progressing   Problem: Education: Goal: Knowledge of disease or condition will improve Outcome: Progressing Goal: Knowledge of secondary prevention will improve (MUST DOCUMENT ALL) Outcome: Progressing Goal: Knowledge of patient specific risk factors will improve (DELETE if not current risk factor) Outcome: Progressing   Problem: Ischemic Stroke/TIA Tissue Perfusion: Goal: Complications of ischemic stroke/TIA will be minimized Outcome: Progressing   Problem: Coping: Goal: Will verbalize positive feelings about self Outcome: Progressing Goal: Will identify appropriate support needs Outcome:  Progressing   Problem: Self-Care: Goal: Ability to participate in self-care as condition permits will improve Outcome: Progressing Goal: Verbalization of feelings and concerns over difficulty with self-care will improve Outcome: Progressing Goal: Ability to communicate needs accurately will improve Outcome: Progressing  Problem: Nutrition: Goal: Risk of aspiration will decrease Outcome: Progressing Goal: Dietary intake will improve Outcome: Progressing

## 2023-11-13 NOTE — Evaluation (Signed)
 Occupational Therapy Evaluation Patient Details Name: Brad Kirk MRN: [id number] DOB: [birth year] Today's Date: 11/13/2023   History of Present Illness   Brad Kirk is a 78 y.o. male with medical history significant for hypertension, now presenting with left-sided weakness.     Patient reports that he was in his usual state and having an uneventful day until roughly 2:30 PM when he developed a strange sensation on his left side and found his left arm and leg to be weak.  He reports that he had similar symptoms a few days ago that resolved spontaneously.  Today, symptoms persisted.  He denies any associated chest pain or palpitations, recent fall or trauma, or recent fevers or chills.  States that he was diagnosed with hypertension previously but has not seen a physician or taken any medication in years. (per MD)     Clinical Impressions Pt agreeable to OT and PT co-evaluation. Pt is independent at baseline without AD. Today the pt completed bed mobility without physical assist and ambulated for tranfers without AD with mod I to supervision assist for safety due to mild balance deficits. L UE is weak compared to the right with deficits noted in gross and fine motor coordination. Pt still able to complete ADL's mostly independent, but with increased difficulty due to deficits above. Pt left in the chair with call bell within reach. Pt will benefit from continued OT in the hospital and recommended venue below to increase strength, balance, and endurance for safe ADL's.        If plan is discharge home, recommend the following:   A little help with walking and/or transfers;Assist for transportation;Help with stairs or ramp for entrance;Assistance with cooking/housework     Functional Status Assessment   Patient has had a recent decline in their functional status and demonstrates the ability to make significant improvements in function in a reasonable and predictable amount of  time.     Equipment Recommendations   None recommended by OT             Precautions/Restrictions   Precautions Precautions: Fall Recall of Precautions/Restrictions: Intact Restrictions Weight Bearing Restrictions Per Provider Order: No     Mobility Bed Mobility Overal bed mobility: Independent                  Transfers Overall transfer level: Needs assistance   Transfers: Sit to/from Stand, Bed to chair/wheelchair/BSC Sit to Stand: Modified independent (Device/Increase time), Supervision     Step pivot transfers: Supervision, Modified independent (Device/Increase time)     General transfer comment: Mildly unsteady without AD. No real physical assist needed.      Balance Overall balance assessment: Mild deficits observed, not formally tested                                         ADL either performed or assessed with clinical judgement   ADL Overall ADL's : Needs assistance/impaired     Grooming: Standing;Modified independent;Supervision/safety               Lower Body Dressing: Modified independent;Sitting/lateral leans   Toilet Transfer: Modified Independent;Supervision/safety;Ambulation Toilet Transfer Details (indicate cue type and reason): Ambulated and stood at the toilet to urinate.         Functional mobility during ADLs: Modified independent;Supervision/safety       Vision Baseline Vision/History: 0 No visual deficits Ability to  See in Adequate Light: 0 Adequate Vision Assessment?: No apparent visual deficits (Vision assessment was [facility] but pt was later noted to run into object on R side. Unsure if this is balance or possible visual deficit.)     Perception Perception: Not tested       Praxis Praxis: Impaired Praxis Impairment Details: Motor planning Praxis-Other Comments: L UE   Pertinent Vitals/Pain Pain Assessment Pain Assessment:  (tingling funny feeling in L hand.)     Extremity/Trunk  Assessment Upper Extremity Assessment Upper Extremity Assessment: RUE deficits/detail;LUE deficits/detail;Right hand dominant RUE Deficits / Details: WFL RUE Sensation: WNL RUE Coordination: WNL LUE Deficits / Details: 3-/5 shoulder flexion; 4+/5 shoulder abduction; 4/5 elbow and wrist flexion/extension; 4+/5 grip. Poor fine and gross motor skills. LUE Sensation: WNL (Pt does report tingling in L hand.) LUE Coordination: decreased fine motor;decreased gross motor   Lower Extremity Assessment Lower Extremity Assessment: Defer to PT evaluation   Cervical / Trunk Assessment Cervical / Trunk Assessment: Normal   Communication Communication Communication: No apparent difficulties   Cognition Arousal: Alert Behavior During Therapy: WFL for tasks assessed/performed Cognition: No apparent impairments                               Following commands: Intact       Cueing  General Comments   Cueing Techniques: Verbal cues;Tactile cues                 Home Living Family/patient expects to be discharged to:: Private residence Living Arrangements: Alone Available Help at Discharge: Family;Available PRN/intermittently Type of Home: House Home Access: Stairs to enter [employer] of Steps: 2 Entrance Stairs-Rails: None Home Layout: One level     Bathroom Shower/Tub: Chief Strategy Officer: Handicapped height Bathroom Accessibility: Yes How Accessible: Accessible via wheelchair;Accessible via walker Home Equipment: Grab bars - tub/shower          Prior Functioning/Environment Prior Level of Function : Independent/Modified Independent             Mobility Comments: Community ambulator without AD; drives ADLs Comments: Independent    OT Problem List: Impaired sensation;Decreased coordination;Impaired balance (sitting and/or standing);Decreased strength   OT Treatment/Interventions: Self-care/ADL training;Therapeutic  activities;Neuromuscular education;Therapeutic exercise;Patient/family education;Visual/perceptual remediation/compensation;Balance training      OT Goals(Current goals can be found in the care plan section)   Acute Rehab OT Goals Patient Stated Goal: return home OT Goal Formulation: With patient Time For Goal Achievement: 11/27/23 Potential to Achieve Goals: Good   OT Frequency:  Min 2X/week    Co-evaluation PT/OT/SLP Co-Evaluation/Treatment: Yes Reason for Co-Treatment: To address functional/ADL transfers   OT goals addressed during session: ADL's and self-care      AM-PAC OT 6 Clicks Daily Activity     Outcome Measure Help from another person eating meals?: None Help from another person taking care of personal grooming?: None Help from another person toileting, which includes using toliet, bedpan, or urinal?: None Help from another person bathing (including washing, rinsing, drying)?: None Help from another person to put on and taking off regular upper body clothing?: None Help from another person to put on and taking off regular lower body clothing?: None 6 Click Score: 24   End of Session Equipment Utilized During Treatment: Gait belt  Activity Tolerance: Patient tolerated treatment well Patient left: in chair;with call bell/phone within reach  OT Visit Diagnosis: Unsteadiness on feet (R26.81);Other abnormalities of gait and mobility (  R26.89);Muscle weakness (generalized) (M62.81);Other symptoms and signs involving the nervous system (R29.898);Hemiplegia and hemiparesis Hemiplegia - Right/Left: Left Hemiplegia - dominant/non-dominant: Non-Dominant Hemiplegia - caused by: Cerebral infarction                Time: 9187-9164 OT Time Calculation (min): 23 min Charges:  OT General Charges $OT Visit: 1 Visit OT Evaluation $OT Eval Low Complexity: 1 Low  Alethia Melendrez OT, MOT  Jayson Person 11/13/2023, 10:58 AM

## 2023-11-13 NOTE — Progress Notes (Signed)
 SLP Cancellation Note  Patient Details Name: Brad Kirk MRN: [id number] DOB: [birth year]   Cancelled treatment:       Reason Eval/Treat Not Completed: Patient at procedure or test/unavailable (other medical staff in room meetng with Pt/family. SLP will check back tomorrow for SLE.)  Thank you,  Lamar Candy, CCC-SLP [phone]  Yolanda Huffstetler 11/13/2023, 2:19 PM

## 2023-11-13 NOTE — Discharge Instructions (Addendum)
  If you would like to speak with the [facility] about your eligibility for health benefits call  [phone].  A referral has been made to [facility] [phone]) on your behalf. [facility] is a program that serves uninsured residents of [place], [place].  Services provided include: Primary Care  Nurse Case Management      Specialty Care  Pharmacy services and Medication      Dental Care   Assistance Programs [facility] will contact you after your discharge from the hospital.   [facility]  *If interested call to discuss self-pay options. [address] Brad Kirk [place], [place] [address] Phone: [phone]

## 2023-11-13 NOTE — Evaluation (Signed)
 Physical Therapy Evaluation Patient Details Name: Brad Kirk MRN: [id number] DOB: 08/08/1945 Today's Date: 11/13/2023  History of Present Illness  Brad Kirk is a 78 y.o. male with medical history significant for hypertension, now presenting with left-sided weakness.     Patient reports that he was in his usual state and having an uneventful day until roughly 2:30 PM when he developed a strange sensation on his left side and found his left arm and leg to be weak.  He reports that he had similar symptoms a few days ago that resolved spontaneously.  Today, symptoms persisted.  He denies any associated chest pain or palpitations, recent fall or trauma, or recent fevers or chills.  States that he was diagnosed with hypertension previously but has not seen a physician or taken any medication in years.   Clinical Impression  Patient functioning near baseline for functional mobility and gait demonstrating independence for bed mobility tasks and transfers, but patient does present with mild balance deficits upon standing and intermittently holds onto surrounding furniture/railing. Patient demonstrates fair return for ambulating in the room/hallway without AD but requires steadying assist during gait, especially with turns. Patient will benefit from continued skilled physical therapy in hospital and recommended venue below to increase strength, balance, endurance for safe ADLs and gait.         If plan is discharge home, recommend the following: Help with stairs or ramp for entrance;A little help with walking and/or transfers   Can travel by private vehicle        Equipment Recommendations None recommended by PT  Recommendations for Other Services       Functional Status Assessment Patient has had a recent decline in their functional status and demonstrates the ability to make significant improvements in function in a reasonable and predictable amount of time.     Precautions /  Restrictions Precautions Precautions: Fall Recall of Precautions/Restrictions: Intact Restrictions Weight Bearing Restrictions Per Provider Order: No      Mobility  Bed Mobility Overal bed mobility: Independent                  Transfers Overall transfer level: Needs assistance   Transfers: Sit to/from Stand, Bed to chair/wheelchair/BSC Sit to Stand: Modified independent (Device/Increase time), Supervision   Step pivot transfers: Supervision, Modified independent (Device/Increase time)       General transfer comment: Mildly unsteady without AD. No real physical assist needed.    Ambulation/Gait Ambulation/Gait assistance: Supervision, Contact guard assist Gait Distance (Feet): 100 Feet Assistive device: None Gait Pattern/deviations: Drifts right/left, Decreased step length - right, Decreased step length - left, Decreased stride length Gait velocity: WFL     General Gait Details: mildy unsteady, CGA steadying assist with turns  Careers information officer     Tilt Bed    Modified Rankin (Stroke Patients Only)       Balance Overall balance assessment: Mild deficits observed, not formally tested                                           Pertinent Vitals/Pain Pain Assessment Pain Assessment: No/denies pain (c/o L hand numbness)    Home Living Family/patient expects to be discharged to:: Private residence Living Arrangements: Alone Available Help at Discharge: Family;Available PRN/intermittently Type of Home: House Home Access: Stairs to enter  Entrance Stairs-Rails: None Entrance Stairs-Number of Steps: 2   Home Layout: One level Home Equipment: Grab bars - tub/shower      Prior Function Prior Level of Function : Independent/Modified Independent             Mobility Comments: Community ambulator without AD; drives ADLs Comments: Independent     Extremity/Trunk Assessment   Upper Extremity  Assessment Upper Extremity Assessment: Defer to OT evaluation RUE Deficits / Details: WFL RUE Sensation: WNL RUE Coordination: WNL LUE Deficits / Details: 3-/5 shoulder flexion; 4+/5 shoulder abduction; 4/5 elbow and wrist flexion/extension; 4+/5 grip. Poor fine and gross motor skills. LUE Sensation: WNL (Pt does report tingling in L hand.) LUE Coordination: decreased fine motor;decreased gross motor    Lower Extremity Assessment Lower Extremity Assessment: LLE deficits/detail;RLE deficits/detail RLE Deficits / Details: WFL RLE Sensation: WNL RLE Coordination: WNL LLE Deficits / Details: 4-/5 hip flexion, 4-/5 knee flexion, 4+ knee ext LLE Sensation: WNL LLE Coordination: WNL    Cervical / Trunk Assessment Cervical / Trunk Assessment: Normal  Communication   Communication Communication: No apparent difficulties    Cognition Arousal: Alert Behavior During Therapy: WFL for tasks assessed/performed   PT - Cognitive impairments: No apparent impairments                         Following commands: Intact       Cueing Cueing Techniques: Verbal cues, Tactile cues     General Comments      Exercises     Assessment/Plan    PT Assessment Patient needs continued PT services  PT Problem List Decreased balance;Decreased strength;Decreased mobility;Decreased activity tolerance       PT Treatment Interventions Neuromuscular re-education;DME instruction;Therapeutic activities;Gait training;Therapeutic exercise;Patient/family education;Balance training;Functional mobility training    PT Goals (Current goals can be found in the Care Plan section)  Acute Rehab PT Goals Patient Stated Goal: return home PT Goal Formulation: With patient Time For Goal Achievement: 11/27/23 Potential to Achieve Goals: Good    Frequency Min 3X/week     Co-evaluation PT/OT/SLP Co-Evaluation/Treatment: Yes Reason for Co-Treatment: To address functional/ADL transfers PT goals  addressed during session: Mobility/safety with mobility;Balance;Proper use of DME OT goals addressed during session: ADL's and self-care       AM-PAC PT 6 Clicks Mobility  Outcome Measure Help needed turning from your back to your side while in a flat bed without using bedrails?: None Help needed moving from lying on your back to sitting on the side of a flat bed without using bedrails?: None Help needed moving to and from a bed to a chair (including a wheelchair)?: A Little Help needed standing up from a chair using your arms (e.g., wheelchair or bedside chair)?: None Help needed to walk in hospital room?: A Little Help needed climbing 3-5 steps with a railing? : A Little 6 Click Score: 21    End of Session Equipment Utilized During Treatment: Gait belt Activity Tolerance: Patient tolerated treatment well Patient left: Other (comment) (pt sent off to MRI) Nurse Communication: Mobility status PT Visit Diagnosis: Muscle weakness (generalized) (M62.81);Unsteadiness on feet (R26.81);Other abnormalities of gait and mobility (R26.89)    Time: 9193-9165 PT Time Calculation (min) (ACUTE ONLY): 28 min   Charges:   PT Evaluation $PT Eval Moderate Complexity: 1 Mod PT Treatments $Therapeutic Activity: 23-37 mins PT General Charges $$ ACUTE PT VISIT: 1 Visit         12:03 PM, 11/13/23,  Shyheim Tanney, SPT

## 2023-11-13 NOTE — Consult Note (Addendum)
 I connected with  Brad Kirk on 11/13/23 by a video enabled telemedicine application and verified that I am speaking with the correct person using two identifiers.   I discussed the limitations of evaluation and management by telemedicine. The patient expressed understanding and agreed to proceed.  Location of patient: [facility] Location of physician: [facility]  Neurology Consultation Reason for Consult: Stroke Referring Physician: Dr Brad Kirk  CC: Left-sided weakness  History is obtained from: Patient, family at bedside, chart review  HPI: Brad Kirk is a 78 y.o. male with no significant past medical history (of note does not see any physician) who presented with left-sided weakness.  Patient states he was watching TV and suddenly noticed his left side to be weak at around 1430 p.m. yesterday.  Of note patient reports similar symptoms briefly about 2 weeks ago that completely resolved.  Last known normal: 11/29/2023 at 1430 Event happened at home No tPA or thrombectomy as outside window mRS 0   ROS: All other systems reviewed and negative except as noted in the HPI.   Past Medical History:  Diagnosis Date   Hypertension     History reviewed. No pertinent family history.   Social History:  reports that he has never smoked. He has never used smokeless tobacco. He reports that he does not currently use alcohol. He reports current drug use. Drug: Marijuana.   No medications prior to admission.     Exam: Current vital signs: BP (!) 174/84 (BP Location: Right Arm)   Pulse (!) 58   Temp 98 F (36.7 C) (Oral)   Resp 19   Ht 5' 10 (1.778 m)   Wt 81.6 kg   SpO2 100%   BMI 25.83 kg/m  Vital signs in last 24 hours: Temp:  [98 F (36.7 C)-98.3 F (36.8 C)] 98 F (36.7 C) (08/14 0634) Pulse Rate:  [53-88] 58 (08/14 0634) Resp:  [18-19] 19 (08/13 2315) BP: (141-182)/(76-94) 174/84 (08/14 0634) SpO2:  [95 %-100 %] 100 % (08/14  0634) Weight:  [81.6 kg] 81.6 kg (08/13 2223)   Physical Exam  Constitutional: Appears well-developed and well-nourished.  Psych: Affect appropriate to situation Neuro: AO x 3, no aphasia, cranial nerves grossly intact except left facial droop, antigravity strength in all 4 extremities with subtle drift in left upper extremity, sensory decreased to light touch in left upper extremity, FTN intact bilaterally  NIHSS 3  INPUTS: 1A: Level of consciousness --> 0 = Alert; keenly responsive 1B: Ask month and age --> 0 = Both questions right 1C: 'Blink eyes' & 'squeeze hands' --> 0 = Performs both tasks 2: Horizontal extraocular movements --> 0 = Normal 3: Visual fields --> 0 = No visual loss 4: Facial palsy --> 1 = Minor paralysis (flat nasolabial fold, smile asymmetry) 5A: Left arm motor drift --> 1 = Drift, but doesn't hit bed 5B: Right arm motor drift --> 0 = No drift for 10 seconds 6A: Left leg motor drift --> 0 = No drift for 5 seconds 6B: Right leg motor drift --> 0 = No drift for 5 seconds 7: Limb Ataxia --> 0 = No ataxia 8: Sensation --> 1 = Mild-moderate loss: less sharp/more dull  9: Language/aphasia --> 0 = Normal; no aphasia 10: Dysarthria --> 0 = Normal 11: Extinction/inattention --> 0 = No abnormality    I have reviewed labs in epic and the results pertinent to this consultation are: CBC:  Recent Labs  Lab 11/12/23 2230 11/13/23  0441  WBC 7.2 8.4  NEUTROABS 4.4  --   HGB 12.8* 10.9*  HCT 39.2 33.7*  MCV 97.8 97.7  PLT 209 185    Basic Metabolic Panel:  Lab Results  Component Value Date   NA 141 11/12/2023   K 3.3 (L) 11/12/2023   CO2 24 11/12/2023   GLUCOSE 134 (H) 11/12/2023   BUN 14 11/12/2023   CREATININE 1.33 (H) 11/12/2023   CALCIUM  9.3 11/12/2023   GFRNONAA 56 (L) 11/12/2023   GFRAA  10/06/2007    >60        The eGFR has been calculated using the MDRD equation. This calculation has not been validated in all clinical   Lipid Panel:  Lab  Results  Component Value Date   LDLCALC 110 (H) 11/13/2023   HgbA1c: No results found for: HGBA1C Urine Drug Screen:     Component Value Date/Time   LABOPIA NONE DETECTED 11/13/2023 0621   COCAINSCRNUR NONE DETECTED 11/13/2023 0621   LABBENZ NONE DETECTED 11/13/2023 0621   AMPHETMU NONE DETECTED 11/13/2023 0621   THCU POSITIVE (A) 11/13/2023 0621   LABBARB NONE DETECTED 11/13/2023 0621    Alcohol Level     Component Value Date/Time   ETH <15 11/12/2023 2230     I have reviewed the images obtained:  CT head without contrast 11/12/2023:  Subtle loss of gray-white matter differentiation involving the high right frontoparietal region, concerning for an evolving acute right MCA territory infarct. No intracranial hemorrhage. Aspects = 8.  CTA head and neck with and without contrast 11/12/2023: Intraluminal filling defect at the right ICA terminus, suspicious for thrombus. Resultant severe high-grade stenosis at this level.Moderate atheromatous stenosis at the supraclinoid left ICA. No hemodynamically significant stenosis within the neck.  CT perfusion 11/29/2023: 4 cc acute core infarct at the right parietal convexity, consistent with an acute right MCA territory infarct. No significant surrounding ischemic penumbra by CT perfusion.  MRI brain without contrast 11/13/2023: Acute ischemic infarct in the right parietal cortex and right frontal parietal border zone. There is petechial hemorrhage in the right parietal cortex    ASSESSMENT/PLAN: 78 year old male presented with left-sided weakness.  Acute ischemic stroke Etiology: Likely due to hypoperfusion.  Of note, CTA head and neck concerning for thrombus in right ICA which could be cardio-embolic  Recommendations: - Discussed with Dr. Leni.  Recommend aspirin  81 mg and Plavix  75 mg daily for 3 months followed by aspirin  81 mg daily - Recommend atorvastatin  40 mg daily - Goal blood pressure: Permissive hypertension for today followed  by gradual normotension over the next 2 weeks - TTE ordered and pending.  If negative for thrombus, recommend cardiac monitor to look for A-fib - Stroke education including BEFAST - Follow-up with neurology in 2 to 3 months (order placed).  May need repeat CTA at the time of follow-up. - Discussed plan with patient and family at bedside - Discussed plan with Dr. Ricky secure chat   Thank you for allowing us  to participate in the care of this patient. If you have any further questions, please contact  me or neurohospitalist.   Arlin Krebs [facility] neurohospitalist

## 2023-11-13 NOTE — Discharge Summary (Signed)
 Physician Discharge Summary   Patient: Brad Kirk MRN: [id number] DOB: [birth year]  Admit date:     11/12/2023  Discharge date: 11/13/23  Discharge Physician: Eric Nunnery   PCP: Patient, No Pcp Per   Recommendations at discharge:  Repeat basic metabolic panel to follow electrolytes and renal function Reassess blood pressure and adjust antihypertensive regimen Make sure patient follow-up with neurology as instructed  Discharge Diagnoses: Principal Problem:   Acute ischemic stroke [name]) Active Problems:   Essential hypertension   CKD stage 3a, GFR 45-59 ml/min [name]) GERD   Brief Hospital admission narrative: As per H&P written by Dr. Charlton on 11/12/2023 Brad Kirk is a 78 y.o. male with medical history significant for hypertension, now presenting with left-sided weakness.   Patient reports that he was in his usual state and having an uneventful day until roughly 2:30 PM when he developed a strange sensation on his left side and found his left arm and leg to be weak.  He reports that he had similar symptoms a few days ago that resolved spontaneously.  Today, symptoms persisted.  He denies any associated chest pain or palpitations, recent fall or trauma, or recent fevers or chills.  States that he was diagnosed with hypertension previously but has not seen a physician or taken any medication in years.   ED Course: Upon arrival to the ED, patient is found to be afebrile and saturating mid 90s on room air with normal HR and elevated BP.  Labs are most notable for potassium 3.3, creatinine 1.33, normal WBC, and hemoglobin 12.8.  CT perfusion study is concerning for 4 cm acute infarct at the right parietal convexity without significant surrounding ischemic penumbra.  CTA reveals filling defect of the right ICA terminus suspicious for thrombus with resulting high-grade stenosis.   Teleneurology evaluated the patient in the ED and the patient was given 324 mg of  aspirin .  Assessment and Plan: 1-acute ischemic stroke - Appreciate assistance and recommendation by neurology service - Lipid panel demonstrating LDL 110, HDL 45, triglycerides 59 and total cholesterol 167 - 2D echo demonstrating preserved ejection fraction, no significant valvular disorder and no thrombi. - CT head and neck with intraluminal filling defect at the right ICA terminus suspicious for thrombus.  Resultant severe high-grade stenosis at the level.  Moderate athero to mottled stenosis at the supraclinoid left ICA.  No hemodynamically significant stenosis within the neck. -Following neurology recommendations will do aspirin  and Plavix  for 26-month and subsequent aspirin  daily for secondary prevention. - Event cardiac monitoring to be arranged as an outpatient - Start treatment with losartan  and a statin for further risk factor modifications - Outpatient follow-up with neurology in the next 2 to 61-month will be arranged. - Physical therapy recommending outpatient PT - No significant residual deficit found.  2-hypertension - Permissive hypertension was allowed for over 48 hours in the setting of acute ischemic stroke - Losartan  to be started - Continue to follow blood pressure with a goal of systolic blood pressure less than 130 - Follow heart healthy/low-sodium diet.  3-GERD/GI prophylaxis - Continue PPI.  4-hyperlipidemia - LDL goal less than 70 - Patient has been started on a statin - Heart healthy/low-fat diet discussed with patient.  5-chronic kidney disease stage IIIa - Appears to be secondary to hypertension - Continue maintaining adequate hydration and minimize nephrotoxic agents - Follow renal function trend - Continue blood pressure management.  Consultants: Neurology service Procedures performed: See below for x-ray reports. Disposition: Home Diet recommendation:  Heart healthy/low-sodium diet.  DISCHARGE MEDICATION: Allergies as of 11/13/2023   No Known  Allergies      Medication List     TAKE these medications    aspirin  81 MG chewable tablet Chew 1 tablet (81 mg total) by mouth daily. Start taking on: November 14, 2023   atorvastatin  40 MG tablet Commonly known as: LIPITOR Take 1 tablet (40 mg total) by mouth daily. Start taking on: November 14, 2023   clopidogrel  75 MG tablet Commonly known as: PLAVIX  Take 1 tablet (75 mg total) by mouth daily. Start taking on: November 14, 2023   losartan  25 MG tablet Commonly known as: [name]  Take 0.5 tablets (12.5 mg total) by mouth daily. Start taking on: November 16, 2023   pantoprazole  40 MG tablet Commonly known as: Protonix  Take 1 tablet (40 mg total) by mouth daily.        Discharge Exam: Filed Weights   11/12/23 2223  Weight: 81.6 kg   General exam: Alert, awake, oriented x 3; overall improved and in no acute distress.  Wanting to go home. Respiratory system: Clear to auscultation. Respiratory effort normal.  Good saturation on room air. Cardiovascular system:RRR. No rubs or gallops; no JVD.  Telemetry without appreciated arrhythmia while hospitalized. Gastrointestinal system: Abdomen is nondistended, soft and nontender. No organomegaly or masses felt. Normal bowel sounds heard. Central nervous system: Reporting left upper extremity numbness and appreciated 3 out of 5 muscle strength in his left upper extremity; rest of neurologic exam without abnormalities or acute deficits. Extremities: No C/C/E, +pedal pulses Skin: No rashes, lesions or ulcers Psychiatry: Judgement and insight appear normal. Mood & affect appropriate.    Condition at discharge: Stable and improved.  The results of significant diagnostics from this hospitalization (including imaging, microbiology, ancillary and laboratory) are listed below for reference.   Imaging Studies: ECHOCARDIOGRAM COMPLETE Result Date: 11/13/2023    ECHOCARDIOGRAM REPORT   Patient Name:   Brad Kirk Date of Exam: 11/13/2023  Medical Rec #:  [id number]         Height:       70.0 in Accession #:    [id number]        Weight:       180.0 lb Date of Birth:  [birth year]         BSA:          1.996 m Patient Age:    77 years          BP:           174/84 mmHg Patient Gender: M                 HR:           59 bpm. Exam Location:  [facility] Procedure: 2D Echo, Cardiac Doppler and Color Doppler (Both Spectral and Color            Flow Doppler were utilized during procedure). Indications:    Stroke  History:        Patient has no prior history of Echocardiogram examinations.                 Stroke; Risk Factors:Hypertension.  Sonographer:    Ellouise Mose RDCS Referring Phys: [id number] TIMOTHY S OPYD  Sonographer Comments: Technically difficult study due to poor echo windows. Apicals very high and lateral IMPRESSIONS  1. Left ventricular ejection fraction, by estimation, is 60 to 65%. The left ventricle has normal function. The left ventricle has no  regional wall motion abnormalities. There is mild asymmetric left ventricular hypertrophy of the basal segment. Left ventricular diastolic parameters are indeterminate. False tendons noted at LV apex (normal variant).  2. Right ventricular systolic function is normal. The right ventricular size is normal. Tricuspid regurgitation signal is inadequate for assessing PA pressure.  3. The mitral valve is grossly normal. Mild mitral valve regurgitation.  4. The aortic valve is tricuspid. Aortic valve regurgitation is not visualized.  5. The inferior vena cava is normal in size with greater than 50% respiratory variability, suggesting right atrial pressure of 3 mmHg. Comparison(s): No prior Echocardiogram. FINDINGS  Left Ventricle: Left ventricular ejection fraction, by estimation, is 60 to 65%. The left ventricle has normal function. The left ventricle has no regional wall motion abnormalities. The left ventricular internal cavity size was normal in size. There is  mild asymmetric left ventricular hypertrophy of  the basal segment. Left ventricular diastolic parameters are indeterminate. Right Ventricle: The right ventricular size is normal. No increase in right ventricular wall thickness. Right ventricular systolic function is normal. Tricuspid regurgitation signal is inadequate for assessing PA pressure. Left Atrium: Left atrial size was normal in size. Right Atrium: Right atrial size was normal in size. Pericardium: There is no evidence of pericardial effusion. Mitral Valve: The mitral valve is grossly normal. Mild mitral valve regurgitation. Tricuspid Valve: The tricuspid valve is grossly normal. Tricuspid valve regurgitation is trivial. Aortic Valve: The aortic valve is tricuspid. There is mild aortic valve annular calcification. Aortic valve regurgitation is not visualized. Pulmonic Valve: The pulmonic valve was grossly normal. Pulmonic valve regurgitation is trivial. Aorta: The aortic root and ascending aorta are structurally normal, with no evidence of dilitation. Venous: The inferior vena cava is normal in size with greater than 50% respiratory variability, suggesting right atrial pressure of 3 mmHg. IAS/Shunts: No atrial level shunt detected by color flow Doppler. Additional Comments: 3D was performed not requiring image post processing on an independent workstation and was indeterminate.  LEFT VENTRICLE PLAX 2D LVIDd:         4.90 cm     Diastology LVIDs:         3.20 cm     LV e' medial:    6.09 cm/s LV PW:         1.00 cm     LV E/e' medial:  12.3 LV IVS:        1.10 cm     LV e' lateral:   13.20 cm/s LVOT diam:     2.30 cm     LV E/e' lateral: 5.7 LV SV:         91 LV SV Index:   45 LVOT Area:     4.15 cm  LV Volumes (MOD) LV vol d, MOD A2C: 71.6 ml LV vol d, MOD A4C: 72.0 ml LV vol s, MOD A2C: 25.6 ml LV vol s, MOD A4C: 26.0 ml LV SV MOD A2C:     46.0 ml LV SV MOD A4C:     72.0 ml LV SV MOD BP:      45.2 ml RIGHT VENTRICLE             IVC RV S prime:     25.00 cm/s  IVC diam: 1.20 cm TAPSE (M-mode): 1.9 cm  LEFT ATRIUM             Index        RIGHT ATRIUM          Index LA  diam:        3.50 cm 1.75 cm/m   RA Area:     8.20 cm LA Vol (A2C):   34.9 ml 17.49 ml/m  RA Volume:   13.10 ml 6.56 ml/m LA Vol (A4C):   29.0 ml 14.53 ml/m LA Biplane Vol: 34.0 ml 17.03 ml/m  AORTIC VALVE LVOT Vmax:   99.20 cm/s LVOT Vmean:  65.400 cm/s LVOT VTI:    0.218 m  AORTA Ao Root diam: 3.10 cm Ao Asc diam:  3.30 cm MITRAL VALVE MV Area (PHT): 4.68 cm    SHUNTS MV Decel Time: 162 msec    Systemic VTI:  0.22 m MV E velocity: 75.10 cm/s  Systemic Diam: 2.30 cm MV A velocity: 89.10 cm/s MV E/A ratio:  0.84 Jayson Sierras MD Electronically signed by Jayson Sierras MD Signature Date/Time: 11/13/2023/2:23:02 PM    Final    MR BRAIN WO CONTRAST Result Date: 11/13/2023 CLINICAL DATA:  Weakness EXAM: MRI HEAD WITHOUT CONTRAST TECHNIQUE: Multiplanar, multiecho pulse sequences of the brain and surrounding structures were obtained without intravenous contrast. COMPARISON:  CT/CT angiogram 11/12/2023 FINDINGS: MRI brain: There is an acute infarct involving the right parietal cortex and right frontal parietal border zone. There are areas of petechial hemorrhage in the cortical infarct. The signal in the brain parenchyma is normal. The ventricles are normal. No mass lesion. There are normal flow signals in the carotid arteries and basilar artery. No significant bone marrow signal abnormality. No significant abnormality in the paranasal sinuses or soft tissues. IMPRESSION: Acute ischemic infarct in the right parietal cortex and right frontal parietal border zone. There is petechial hemorrhage in the right parietal cortex Electronically Signed   By: Nancyann Burns M.D.   On: 11/13/2023 10:26   CT HEAD CODE STROKE WO CONTRAST Result Date: 11/12/2023 CLINICAL DATA:  Initial evaluation for acute stroke. EXAM: CT HEAD WITHOUT CONTRAST CT ANGIOGRAPHY HEAD AND NECK CT PERFUSION BRAIN TECHNIQUE: Multidetector CT imaging of the head and neck was  performed using the standard protocol during bolus administration of intravenous contrast. Multiplanar CT image reconstructions and MIPs were obtained to evaluate the vascular anatomy. Carotid stenosis measurements (when applicable) are obtained utilizing NASCET criteria, using the distal internal carotid diameter as the denominator. Multiphase CT imaging of the brain was performed following IV bolus contrast injection. Subsequent parametric perfusion maps were calculated using RAPID software. RADIATION DOSE REDUCTION: This exam was performed according to the departmental dose-optimization program which includes automated exposure control, adjustment of the mA and/or kV according to patient size and/or use of iterative reconstruction technique. CONTRAST:  OMNIPAQUE  IOHEXOL  350 MG/ML SOLN COMPARISON:  Prior study from 08/12/2004 FINDINGS: CT HEAD FINDINGS Brain: Cerebral volume within normal limits for age. Subtle loss of gray-white matter differentiation seen involving the high right frontal parietal region, concerning for an evolving acute right MCA territory infarct. No acute intracranial hemorrhage. No other acute large vessel territory infarct. No mass lesion or midline shift. No hydrocephalus or extra-axial fluid collection. Vascular: No abnormal hyperdense vessel. Calcified atherosclerosis present about the carotid siphons. Skull: Scalp soft tissues demonstrate no acute finding. Calvarium intact. Sinuses/Orbits: Globes orbital soft tissues within normal limits. Paranasal sinuses and mastoid air cells are largely clear. Other: None. ASPECTS [facility] Stroke Program Early CT Score) - Ganglionic level infarction (caudate, lentiform nuclei, internal capsule, insula, M1-M3 cortex): 7 - Supraganglionic infarction (M4-M6 cortex): 1 Total score (0-10 with 10 being normal): 8 Review of the MIP images confirms the above findings CTA  NECK FINDINGS Aortic arch: Standard branching. Imaged portion shows no evidence of  aneurysm or dissection. No significant stenosis of the major arch vessel origins. Right carotid system: No evidence of dissection, stenosis (50% or greater) or occlusion. Left carotid system: No evidence of dissection, stenosis (50% or greater) or occlusion. Vertebral arteries: No evidence of dissection, stenosis (50% or greater) or occlusion. Skeleton: No worrisome osseous lesions. Moderate spondylosis at C6-7 and C7-T1. Other neck: No other acute finding. Upper chest: No other acute finding. Review of the MIP images confirms the above findings CTA HEAD FINDINGS Anterior circulation: Atheromatous irregularity about the carotid siphons bilaterally. Resultant moderate stenosis at the supraclinoid left ICA. On the right, calcified plaque with superimposed intraluminal filling defect present at the right ICA terminus, suspicious for thrombus (series 6, image 103). Resultant severe high-grade stenosis at this level. Underlying atheromatous plaque at this location is likely contributory. A1 segments patent bilaterally. Normal anterior communicating artery complex. Anterior cerebral arteries patent without significant stenosis. No M1 stenosis or occlusion. No proximal MCA branch occlusion. Distal MCA branches perfused and symmetric. No visible downstream complication. Posterior circulation: Atheromatous change about the dominant right V4 segment without stenosis. Left V4 segment patent. Neither PICA origin well visualized. Basilar patent without stenosis. Superior cerebellar and posterior cerebral arteries patent bilaterally. Venous sinuses: Patent allowing for timing the contrast bolus. Anatomic variants: None significant.  No aneurysm. Review of the MIP images confirms the above findings CT Brain Perfusion Findings: ASPECTS: 8 CBF (<30%) Volume: 4mL Perfusion (Tmax>6.0s) volume: 5mL Mismatch Volume: 1mL Infarction Location:Acute core infarct present at the right parietal convexity, consistent with an acute right MCA  territory infarct. No significant surrounding ischemic penumbra by CT perfusion. IMPRESSION: CT HEAD: 1. Subtle loss of gray-white matter differentiation involving the high right frontoparietal region, concerning for an evolving acute right MCA territory infarct. No intracranial hemorrhage. 2. Aspects = 8. CTA HEAD AND NECK: 1. Intraluminal filling defect at the right ICA terminus, suspicious for thrombus. Resultant severe high-grade stenosis at this level. 2. Moderate atheromatous stenosis at the supraclinoid left ICA. 3. No hemodynamically significant stenosis within the neck. CT PERFUSION: 4 cc acute core infarct at the right parietal convexity, consistent with an acute right MCA territory infarct. No significant surrounding ischemic penumbra by CT perfusion. Critical Value/emergent results were called by telephone at the time of interpretation on 11/12/2023 at 10:45 p.m. to provider [facility] , who verbally acknowledged these results. Electronically Signed   By: Morene Hoard M.D.   On: 11/12/2023 23:15   CT ANGIO HEAD NECK W WO CM W PERF (CODE STROKE) Result Date: 11/12/2023 CLINICAL DATA:  Initial evaluation for acute stroke. EXAM: CT HEAD WITHOUT CONTRAST CT ANGIOGRAPHY HEAD AND NECK CT PERFUSION BRAIN TECHNIQUE: Multidetector CT imaging of the head and neck was performed using the standard protocol during bolus administration of intravenous contrast. Multiplanar CT image reconstructions and MIPs were obtained to evaluate the vascular anatomy. Carotid stenosis measurements (when applicable) are obtained utilizing NASCET criteria, using the distal internal carotid diameter as the denominator. Multiphase CT imaging of the brain was performed following IV bolus contrast injection. Subsequent parametric perfusion maps were calculated using RAPID software. RADIATION DOSE REDUCTION: This exam was performed according to the departmental dose-optimization program which includes automated exposure control,  adjustment of the mA and/or kV according to patient size and/or use of iterative reconstruction technique. CONTRAST:  OMNIPAQUE  IOHEXOL  350 MG/ML SOLN COMPARISON:  Prior study from 08/12/2004 FINDINGS: CT HEAD FINDINGS Brain: Cerebral  volume within normal limits for age. Subtle loss of gray-white matter differentiation seen involving the high right frontal parietal region, concerning for an evolving acute right MCA territory infarct. No acute intracranial hemorrhage. No other acute large vessel territory infarct. No mass lesion or midline shift. No hydrocephalus or extra-axial fluid collection. Vascular: No abnormal hyperdense vessel. Calcified atherosclerosis present about the carotid siphons. Skull: Scalp soft tissues demonstrate no acute finding. Calvarium intact. Sinuses/Orbits: Globes orbital soft tissues within normal limits. Paranasal sinuses and mastoid air cells are largely clear. Other: None. ASPECTS ([place] Stroke Program Early CT Score) - Ganglionic level infarction (caudate, lentiform nuclei, internal capsule, insula, M1-M3 cortex): 7 - Supraganglionic infarction (M4-M6 cortex): 1 Total score (0-10 with 10 being normal): 8 Review of the MIP images confirms the above findings CTA NECK FINDINGS Aortic arch: Standard branching. Imaged portion shows no evidence of aneurysm or dissection. No significant stenosis of the major arch vessel origins. Right carotid system: No evidence of dissection, stenosis (50% or greater) or occlusion. Left carotid system: No evidence of dissection, stenosis (50% or greater) or occlusion. Vertebral arteries: No evidence of dissection, stenosis (50% or greater) or occlusion. Skeleton: No worrisome osseous lesions. Moderate spondylosis at C6-7 and C7-T1. Other neck: No other acute finding. Upper chest: No other acute finding. Review of the MIP images confirms the above findings CTA HEAD FINDINGS Anterior circulation: Atheromatous irregularity about the carotid siphons  bilaterally. Resultant moderate stenosis at the supraclinoid left ICA. On the right, calcified plaque with superimposed intraluminal filling defect present at the right ICA terminus, suspicious for thrombus (series 6, image 103). Resultant severe high-grade stenosis at this level. Underlying atheromatous plaque at this location is likely contributory. A1 segments patent bilaterally. Normal anterior communicating artery complex. Anterior cerebral arteries patent without significant stenosis. No M1 stenosis or occlusion. No proximal MCA branch occlusion. Distal MCA branches perfused and symmetric. No visible downstream complication. Posterior circulation: Atheromatous change about the dominant right V4 segment without stenosis. Left V4 segment patent. Neither PICA origin well visualized. Basilar patent without stenosis. Superior cerebellar and posterior cerebral arteries patent bilaterally. Venous sinuses: Patent allowing for timing the contrast bolus. Anatomic variants: None significant.  No aneurysm. Review of the MIP images confirms the above findings CT Brain Perfusion Findings: ASPECTS: 8 CBF (<30%) Volume: 4mL Perfusion (Tmax>6.0s) volume: 5mL Mismatch Volume: 1mL Infarction Location:Acute core infarct present at the right parietal convexity, consistent with an acute right MCA territory infarct. No significant surrounding ischemic penumbra by CT perfusion. IMPRESSION: CT HEAD: 1. Subtle loss of gray-white matter differentiation involving the high right frontoparietal region, concerning for an evolving acute right MCA territory infarct. No intracranial hemorrhage. 2. Aspects = 8. CTA HEAD AND NECK: 1. Intraluminal filling defect at the right ICA terminus, suspicious for thrombus. Resultant severe high-grade stenosis at this level. 2. Moderate atheromatous stenosis at the supraclinoid left ICA. 3. No hemodynamically significant stenosis within the neck. CT PERFUSION: 4 cc acute core infarct at the right parietal  convexity, consistent with an acute right MCA territory infarct. No significant surrounding ischemic penumbra by CT perfusion. Critical Value/emergent results were called by telephone at the time of interpretation on 11/12/2023 at 10:45 p.m. to provider [facility] , who verbally acknowledged these results. Electronically Signed   By: Morene Hoard M.D.   On: 11/12/2023 23:15    Microbiology: No results found for this or any previous visit.  Labs: CBC: Recent Labs  Lab 11/12/23 2230 11/13/23 0441  WBC 7.2 8.4  NEUTROABS  4.4  --   HGB 12.8* 10.9*  HCT 39.2 33.7*  MCV 97.8 97.7  PLT 209 185   Basic Metabolic Panel: Recent Labs  Lab 11/12/23 2230 11/13/23 0441  NA 141  --   K 3.3*  --   CL 107  --   CO2 24  --   GLUCOSE 134*  --   BUN 14  --   CREATININE 1.33*  --   CALCIUM  9.3  --   MG  --  2.0   Liver Function Tests: Recent Labs  Lab 11/12/23 2230  AST 21  ALT 13  ALKPHOS 78  BILITOT 0.6  PROT 8.0  ALBUMIN 4.0   CBG: Recent Labs  Lab 11/12/23 2231  GLUCAP 143*    Discharge time spent:  35 minutes.  Signed: Eric Nunnery, MD [facility] 11/13/2023

## 2023-11-13 NOTE — Progress Notes (Signed)
 2D echo attempted at 1230, consult in room. Will try later

## 2023-11-14 ENCOUNTER — Telehealth: Payer: Self-pay

## 2023-11-14 NOTE — Telephone Encounter (Signed)
 Received notification from [employer] that monitor could not be mailed to patient as they could not reach him. His home number rings and says voicemail not set up.I called sister and her line just rang.

## 2023-11-18 LAB — POCT I-STAT, CHEM 8
BUN: 14 mg/dL (ref 8–23)
Calcium, Ion: 1.15 mmol/L (ref 1.15–1.40)
Chloride: 107 mmol/L (ref 98–111)
Creatinine, Ser: 1.4 mg/dL — ABNORMAL HIGH (ref 0.61–1.24)
Glucose, Bld: 132 mg/dL — ABNORMAL HIGH (ref 70–99)
HCT: 40 % (ref 39.0–52.0)
Hemoglobin: 13.6 g/dL (ref 13.0–17.0)
Potassium: 3.3 mmol/L — ABNORMAL LOW (ref 3.5–5.1)
Sodium: 145 mmol/L (ref 135–145)
TCO2: 24 mmol/L (ref 22–32)

## 2023-11-21 ENCOUNTER — Encounter (HOSPITAL_COMMUNITY): Payer: Self-pay

## 2023-11-21 ENCOUNTER — Other Ambulatory Visit: Payer: Self-pay

## 2023-11-21 ENCOUNTER — Ambulatory Visit (HOSPITAL_COMMUNITY): Attending: Internal Medicine

## 2023-11-21 DIAGNOSIS — R29898 Other symptoms and signs involving the musculoskeletal system: Secondary | ICD-10-CM | POA: Insufficient documentation

## 2023-11-21 DIAGNOSIS — I639 Cerebral infarction, unspecified: Secondary | ICD-10-CM | POA: Diagnosis not present

## 2023-11-21 DIAGNOSIS — Z7409 Other reduced mobility: Secondary | ICD-10-CM | POA: Diagnosis present

## 2023-11-21 DIAGNOSIS — Z789 Other specified health status: Secondary | ICD-10-CM | POA: Diagnosis present

## 2023-11-21 DIAGNOSIS — R200 Anesthesia of skin: Secondary | ICD-10-CM | POA: Insufficient documentation

## 2023-11-21 NOTE — Therapy (Signed)
 OUTPATIENT PHYSICAL THERAPY NEURO EVALUATION   Patient Name: Brad Kirk MRN: [id number] DOB:1945/07/12, 78 y.o., male Today's Date: 11/21/2023    END OF SESSION:  PT End of Session - 11/21/23 1515     Visit Number 1    Date for PT Re-Evaluation 01/02/24    Authorization Type MEDICARE PART A AND B    Progress Note Due on Visit 10    PT Start Time 1515    PT Stop Time 1558    PT Time Calculation (min) 43 min    Activity Tolerance Patient tolerated treatment well    Behavior During Therapy WFL for tasks assessed/performed          Past Medical History:  Diagnosis Date   Hypertension    History reviewed. No pertinent surgical history. Patient Active Problem List   Diagnosis Date Noted   CKD stage 3a, GFR 45-59 ml/min (HCC) 11/13/2023   Acute ischemic stroke (HCC) 11/12/2023   UNSPECIFIED VISUAL LOSS 06/01/2008   Essential hypertension 06/01/2008   Backache 06/01/2008   VERTIGO 06/01/2008   Headache 06/01/2008    PCP: No PCP REFERRING PROVIDER: Ricky Fines, MD ONSET DATE: Stroke occurred the 13th of August, last Wednesday   REFERRING DIAG: I63.9 (ICD-10-CM) - Acute ischemic stroke (HCC)  THERAPY DIAG:  Weakness of left upper extremity  Impaired mobility and ADLs  Left upper extremity numbness  Rationale for Evaluation and Treatment: Rehabilitation  SUBJECTIVE:                                                                                                                                                                                             SUBJECTIVE STATEMENT: Pt states he was watching tv and all of a sudden numbness went down left arm and left leg. Pt states the leg is back to normal now but the left arm is still numb and tingly.  Pt accompanied by: self  PERTINENT HISTORY:    PAIN:  Are you having pain? No  PRECAUTIONS: None  RED FLAGS: None   WEIGHT BEARING RESTRICTIONS: No  FALLS: Has patient fallen in last 6 months? Yes.  Number of falls 1, slid out of chair when stroke happened.  LIVING ENVIRONMENT: Lives with: lives alone Lives in: House/apartment Stairs: Yes: External: 3 steps; on right going up Has following equipment at home: None  PLOF: Independent  PATIENT GOALS: Pt would like to get back on the keyboard, pt states he would like to get some strength back in his legs, would like to be able to walk further.  OBJECTIVE:  Note: Objective measures were completed at Evaluation unless otherwise  noted.  DIAGNOSTIC FINDINGS: CLINICAL DATA:  Weakness   EXAM: MRI HEAD WITHOUT CONTRAST   TECHNIQUE: Multiplanar, multiecho pulse sequences of the brain and surrounding structures were obtained without intravenous contrast.   COMPARISON:  CT/CT angiogram 11/12/2023   FINDINGS: MRI brain:   There is an acute infarct involving the right parietal cortex and right frontal parietal border zone. There are areas of petechial hemorrhage in the cortical infarct.   The signal in the brain parenchyma is normal.   The ventricles are normal.   No mass lesion.   There are normal flow signals in the carotid arteries and basilar artery.   No significant bone marrow signal abnormality.   No significant abnormality in the paranasal sinuses or soft tissues.   IMPRESSION: Acute ischemic infarct in the right parietal cortex and right frontal parietal border zone. There is petechial hemorrhage in the right parietal cortex  COGNITION: Overall cognitive status: Within functional limits for tasks assessed   SENSATION: Light touch: Impaired , left arm  COORDINATION: Finger to nose abnormal on LUE, heel to shin WFL   UPPER EXTREMITY ROM:  Active ROM Right eval Left eval  Shoulder flexion [facility]  Shoulder extension    Shoulder abduction [facility]  Shoulder adduction    Shoulder extension    Shoulder internal rotation    Shoulder external rotation    Elbow flexion    Elbow extension    Wrist flexion     Wrist extension    Wrist ulnar deviation    Wrist radial deviation    Wrist pronation    Wrist supination     (Blank rows = not tested)  UPPER EXTREMITY MMT:  MMT Right eval Left eval  Shoulder flexion 4 3+  Shoulder extension    Shoulder abduction 4 3  Shoulder adduction    Shoulder extension    Shoulder internal rotation 4 3-  Shoulder external rotation 4 3-  Middle trapezius    Lower trapezius    Elbow flexion 4 4-  Elbow extension 4 4-  Wrist flexion    Wrist extension    Wrist ulnar deviation    Wrist radial deviation    Wrist pronation 4 3  Wrist supination 4 3  Grip strength TBA TBA   (Blank rows = not tested)   LOWER EXTREMITY MMT:    MMT Right Eval Left Eval  Hip flexion    Hip extension    Hip abduction    Hip adduction    Hip internal rotation    Hip external rotation    Knee flexion    Knee extension    Ankle dorsiflexion    Ankle plantarflexion    Ankle inversion    Ankle eversion    (Blank rows = not tested)   GAIT: Findings: WFL  FUNCTIONAL TESTS:  5 times sit to stand: 11.49 seconds Timed up and go (TUG): 9.28 seconds 2 minute walk test: 545 feet  SLS 11/21/23: R: 30 seconds  L: 30 seconds  TREATMENT DATE:  11/21/2023  Evaluation: -ROM measured, Strength assessed, HEP prescribed, pt educated on prognosis, findings, and importance of HEP compliance if given.     PATIENT EDUCATION: Education details: Pt was educated on findings of PT evaluation, prognosis, frequency of therapy visits and rationale, attendance policy, and HEP if given.   Person educated: Patient Education method: Explanation, Verbal cues, and Handouts Education comprehension: verbalized understanding, verbal cues required, and needs further education  HOME EXERCISE PROGRAM: Access Code: [email or web address] URL:  [email or web address] Date: 11/21/2023 Prepared by: Lang Ada  Exercises - Supine Bridge  - 1 x daily - 7 x weekly - 3 sets - 10 reps - 5 hold - Sit to Stand with Arms Crossed  - 1 x daily - 7 x weekly - 3 sets - 10 reps - Single Leg Stance  - 1 x daily - 7 x weekly - 1 sets - 3 reps - 30 hold  GOALS: Goals reviewed with patient? No  SHORT TERM GOALS: Target date: 12/12/23  Patient will demonstrate evidence of independence with individualized HEP and will report compliance for at least 3 days per week for optimized progression towards remaining therapy goals. Baseline:  Goal status: INITIAL  2.  Patient will report a 25% improvement of function of LUE during ADL for improved quality of life. Baseline:  Goal status: INITIAL     LONG TERM GOALS: Target date: 01/02/24  Pt will demonstrate a an increase of at least 9 points on the UEFS for improved performance of community ambulation and ADL. Baseline: see objective Goal status: INITIAL  2.  Pt will improve 2 MWT by 50 feet in order to demonstrate improved functional ambulatory capacity in community setting.  Baseline: see objective Goal status: INITIAL  3.  Pt will demonstrate WFL ROM in left hand and LUE and coordination for increased mobility and maximal efficiency of LUE during ADLS. Baseline: see objective Goal status: INITIAL  4.  Pt will demonstrate at least 4-/5 MMT for left upper extremity for increased strength during ADL and community ambulation. Baseline: see objective Goal status: INITIAL  5.  Pt will improve grip strength in LUE by 10lbs in order to improve functionality of LUE during functional activities. Baseline: see objective Goal status: INITIAL   ASSESSMENT:  CLINICAL IMPRESSION: Patient is a 78 y.o. male who was seen today for physical therapy evaluation and treatment for I63.9 (ICD-10-CM) - Acute ischemic stroke (HCC).   Patient demonstrates decreased LUE sensation, strength and  coordination deficits, good LE strength, no pain, and WFL balance. Patient also demonstrates no difficulty with ambulation during today's session over 500 feet on with no significant gait deviations noted. Patient also demonstrates no issues with functional mobility. Pt does demonstrate deficits in LUE strength and coordination that is impacting his ADL. Patient requires education on role of PT, importance of HEP, and encourage to find primary care physician. Patient would benefit from skilled physical therapy for increased coordination of LUE, increased LUE strength for improved performance with ADLs and increased quality of life.   OBJECTIVE IMPAIRMENTS: decreased activity tolerance, decreased ROM, decreased strength, hypomobility, impaired flexibility, and impaired sensation.   ACTIVITY LIMITATIONS: carrying, lifting, reach over head, and hygiene/grooming  PARTICIPATION LIMITATIONS: meal prep, cleaning, laundry, driving, shopping, community activity, and yard work  PERSONAL FACTORS: Age, Fitness, Past/current experiences, and 1 comorbidity: stroke are also affecting patient's functional outcome.   REHAB POTENTIAL: Fair stroke/neurological  CLINICAL DECISION MAKING: Stable/uncomplicated  EVALUATION COMPLEXITY: Low  PLAN:  PT FREQUENCY:  1-2x/week  PT DURATION: 6 weeks  PLANNED INTERVENTIONS: 97110-Therapeutic exercises, 97530- Therapeutic activity, 97112- Neuromuscular re-education, 97535- Self Care, 02859- Manual therapy, Patient/Family education, Balance training, Joint mobilization, Cryotherapy, and Moist heat  PLAN FOR NEXT SESSION: UEFS, Grip strength assessment, Further assessment of LUE ROM and strength, Progress LUE HEP, progress strengthening of LUE, incorporate balance SLS activities    Lang Ada, PT, DPT [facility] Office: [phone] 5:28 PM, 11/21/23

## 2023-11-27 ENCOUNTER — Encounter (HOSPITAL_COMMUNITY): Payer: Self-pay

## 2023-11-27 ENCOUNTER — Ambulatory Visit (HOSPITAL_COMMUNITY)

## 2023-11-27 DIAGNOSIS — R200 Anesthesia of skin: Secondary | ICD-10-CM

## 2023-11-27 DIAGNOSIS — R29898 Other symptoms and signs involving the musculoskeletal system: Secondary | ICD-10-CM

## 2023-11-27 DIAGNOSIS — Z789 Other specified health status: Secondary | ICD-10-CM

## 2023-11-27 NOTE — Therapy (Addendum)
 OUTPATIENT PHYSICAL THERAPY NEURO TREATMENT   Patient Name: Brad Kirk MRN: [id number] DOB:13-Jul-1945, 78 y.o., male Today's Date: 11/27/2023    END OF SESSION:  PT End of Session - 11/27/23 0939     Visit Number 2    Date for PT Re-Evaluation 01/02/24    Authorization Type MEDICARE PART A AND B    Progress Note Due on Visit 10    PT Start Time [phone]   pt late arrival   PT Stop Time 1015    PT Time Calculation (min) 36 min    Activity Tolerance Patient tolerated treatment well    Behavior During Therapy [facility] for tasks assessed/performed          Past Medical History:  Diagnosis Date   Hypertension    History reviewed. No pertinent surgical history. Patient Active Problem List   Diagnosis Date Noted   CKD stage 3a, GFR 45-59 ml/min (HCC) 11/13/2023   Acute ischemic stroke (HCC) 11/12/2023   UNSPECIFIED VISUAL LOSS 06/01/2008   Essential hypertension 06/01/2008   Backache 06/01/2008   VERTIGO 06/01/2008   Headache 06/01/2008    PCP: No PCP REFERRING PROVIDER: Ricky Fines, MD ONSET DATE: Stroke occurred the 13th of August, last Wednesday   REFERRING DIAG: I63.9 (ICD-10-CM) - Acute ischemic stroke (HCC)  THERAPY DIAG:  Weakness of left upper extremity  Left upper extremity numbness  Impaired mobility and ADLs  Rationale for Evaluation and Treatment: Rehabilitation  SUBJECTIVE:                                                                                                                                                                                             SUBJECTIVE STATEMENT: Pt reports he had to walk from [facility]. Reports he still is having weakness in LUE. HEP went well.   EVAL: Pt states he was watching tv and all of a sudden numbness went down left arm and left leg. Pt states the leg is back to normal now but the left arm is still numb and tingly.  Pt accompanied by: self  PERTINENT HISTORY:    PAIN:  Are you having pain?  No  PRECAUTIONS: None  RED FLAGS: None   WEIGHT BEARING RESTRICTIONS: No  FALLS: Has patient fallen in last 6 months? Yes. Number of falls 1, slid out of chair when stroke happened.  LIVING ENVIRONMENT: Lives with: lives alone Lives in: House/apartment Stairs: Yes: External: 3 steps; on right going up Has following equipment at home: None  PLOF: Independent  PATIENT GOALS: Pt would like to get back on the keyboard, pt states he would like to  get some strength back in his legs, would like to be able to walk further.  OBJECTIVE:  Note: Objective measures were completed at Evaluation unless otherwise noted.  DIAGNOSTIC FINDINGS: CLINICAL DATA:  Weakness   EXAM: MRI HEAD WITHOUT CONTRAST   TECHNIQUE: Multiplanar, multiecho pulse sequences of the brain and surrounding structures were obtained without intravenous contrast.   COMPARISON:  CT/CT angiogram 11/12/2023   FINDINGS: MRI brain:   There is an acute infarct involving the right parietal cortex and right frontal parietal border zone. There are areas of petechial hemorrhage in the cortical infarct.   The signal in the brain parenchyma is normal.   The ventricles are normal.   No mass lesion.   There are normal flow signals in the carotid arteries and basilar artery.   No significant bone marrow signal abnormality.   No significant abnormality in the paranasal sinuses or soft tissues.   IMPRESSION: Acute ischemic infarct in the right parietal cortex and right frontal parietal border zone. There is petechial hemorrhage in the right parietal cortex  COGNITION: Overall cognitive status: Within functional limits for tasks assessed   SENSATION: Light touch: Impaired , left arm  COORDINATION: Finger to nose abnormal on LUE, heel to shin WFL   UPPER EXTREMITY ROM:  Active ROM Right eval Left eval  Shoulder flexion [facility]  Shoulder extension    Shoulder abduction [facility]  Shoulder adduction     Shoulder extension    Shoulder internal rotation    Shoulder external rotation    Elbow flexion    Elbow extension    Wrist flexion    Wrist extension    Wrist ulnar deviation    Wrist radial deviation    Wrist pronation    Wrist supination     (Blank rows = not tested)  UPPER EXTREMITY MMT:  MMT Right eval Left eval  Shoulder flexion 4 3+  Shoulder extension    Shoulder abduction 4 3  Shoulder adduction    Shoulder extension    Shoulder internal rotation 4 3-  Shoulder external rotation 4 3-  Middle trapezius    Lower trapezius    Elbow flexion 4 4-  Elbow extension 4 4-  Wrist flexion    Wrist extension    Wrist ulnar deviation    Wrist radial deviation    Wrist pronation 4 3  Wrist supination 4 3  Grip strength 85 lb 65 lb   (Blank rows = not tested)   LOWER EXTREMITY MMT:    MMT Right Eval Left Eval  Hip flexion    Hip extension    Hip abduction    Hip adduction    Hip internal rotation    Hip external rotation    Knee flexion    Knee extension    Ankle dorsiflexion    Ankle plantarflexion    Ankle inversion    Ankle eversion    (Blank rows = not tested)   GAIT: Findings: WFL  FUNCTIONAL TESTS:  5 times sit to stand: 11.49 seconds Timed up and go (TUG): 9.28 seconds 2 minute walk test: 545 feet  SLS 11/21/23: R: 30 seconds  L: 30 seconds   Grip Strength:  R: 85 lb. avg of 3 trials L: 65 lb avg of 3 trials   Upper Extremity Function Scale: 11 / 80 = 13.8 %  TREATMENT DATE:  11/27/23: Review of goals UEFS Grip strength testing ROM- WFL  Putty Squeezes, 2' Red/Brad  Putty Pinch, 2' Red/Brad Shoulder Rows, 15x, RTB Shoulder Extension, 15x, RTB Shoulder IR/ER, RTB, LUE only, 10x UBE, 5' total, level 7 - 4' BUE, level 4 for 1' LUE only    11/21/2023  Evaluation: -ROM measured, Strength assessed, HEP  prescribed, pt educated on prognosis, findings, and importance of HEP compliance if given.     PATIENT EDUCATION: Education details: Pt was educated on findings of PT evaluation, prognosis, frequency of therapy visits and rationale, attendance policy, and HEP if given.   Person educated: Patient Education method: Explanation, Verbal cues, and Handouts Education comprehension: verbalized understanding, verbal cues required, and needs further education  HOME EXERCISE PROGRAM: Access Code: [email or web address] URL: [email or web address] Date: 11/21/2023 Prepared by: Lang Ada  Exercises - Supine Bridge  - 1 x daily - 7 x weekly - 3 sets - 10 reps - 5 hold - Sit to Stand with Arms Crossed  - 1 x daily - 7 x weekly - 3 sets - 10 reps - Single Leg Stance  - 1 x daily - 7 x weekly - 1 sets - 3 reps - 30 hold Access Code: [email or web address] URL: [email or web address] Date: 11/27/2023 Prepared by: Rosaria Powell-Butler  Exercises - Putty Squeezes  - 2 x daily - 7 x weekly - 3 sets - 10 reps - Tip Pinch with Putty  - 2 x daily - 7 x weekly - 3 sets - 10 reps - Shoulder extension with resistance - Neutral  - 2 x daily - 7 x weekly - 3 sets - 10 reps - Shoulder External Rotation [employer]  - 2 x daily - 7 x weekly - 3 sets - 10 reps - Shoulder Internal Rotation Reactive [employer]  - 2 x daily - 7 x weekly - 3 sets - 10 reps - Standing Shoulder Row with Anchored Resistance  - 2 x daily - 7 x weekly - 3 sets - 10 reps   GOALS: Goals reviewed with patient? Yes  SHORT TERM GOALS: Target date: 12/12/23  Patient will demonstrate evidence of independence with individualized HEP and will report compliance for at least 3 days per week for optimized progression towards remaining therapy goals. Baseline:  Goal status: INITIAL  2.  Patient will report a 25% improvement of function of LUE during ADL for improved quality of life. Baseline:  Goal status: INITIAL     LONG TERM  GOALS: Target date: 01/02/24  Pt will demonstrate a an increase of at least 9 points on the UEFS for improved performance of community ambulation and ADL. Baseline: see objective Goal status: INITIAL  2.  Pt will improve 2 MWT by 50 feet in order to demonstrate improved functional ambulatory capacity in community setting.  Baseline: see objective Goal status: INITIAL  3.  Pt will demonstrate WFL ROM in left hand and LUE and coordination for increased mobility and maximal efficiency of LUE during ADLS. Baseline: see objective Goal status: INITIAL  4.  Pt will demonstrate at least 4-/5 MMT for left upper extremity for increased strength during ADL and community ambulation. Baseline: see objective Goal status: INITIAL  5.  Pt will improve grip strength in LUE by 10lbs in order to improve functionality of LUE during functional activities. Baseline: see objective Goal status: INITIAL   ASSESSMENT:  CLINICAL IMPRESSION: Patient tolerates session well. Session focused on LUE strengthening. Patient demonstrates little decreased self perception of function of  LUE on this date but reports and demonstrates a noticeable difference in coordination of fingers and decreased grip and UE strength on L as compared to R. Patient given putty exercises this date to work on grip and finger strength as well as general UE strengthening for LUE. Pt demonstrates most difficulty and weakness with ER/IR with LUE. Ended on UBE on inc level. Patient demonstrates appropriate fatigue at end of session. Patient will benefit from continued skilled physical therapy in order to address current deficits to return to PLOF.    EVAL: Patient is a 78 y.o. male who was seen today for physical therapy evaluation and treatment for I63.9 (ICD-10-CM) - Acute ischemic stroke (HCC).  Patient demonstrates decreased LUE sensation, strength and coordination deficits, good LE strength, no pain, and WFL balance. Patient also demonstrates  no difficulty with ambulation during today's session over 500 feet on with no significant gait deviations noted. Patient also demonstrates no issues with functional mobility. Pt does demonstrate deficits in LUE strength and coordination that is impacting his ADL. Patient requires education on role of PT, importance of HEP, and encourage to find primary care physician. Patient would benefit from skilled physical therapy for increased coordination of LUE, increased LUE strength for improved performance with ADLs and increased quality of life.   OBJECTIVE IMPAIRMENTS: decreased activity tolerance, decreased ROM, decreased strength, hypomobility, impaired flexibility, and impaired sensation.   ACTIVITY LIMITATIONS: carrying, lifting, reach over head, and hygiene/grooming  PARTICIPATION LIMITATIONS: meal prep, cleaning, laundry, driving, shopping, community activity, and yard work  PERSONAL FACTORS: Age, Fitness, Past/current experiences, and 1 comorbidity: stroke are also affecting patient's functional outcome.   REHAB POTENTIAL: Fair stroke/neurological  CLINICAL DECISION MAKING: Stable/uncomplicated  EVALUATION COMPLEXITY: Low  PLAN:  PT FREQUENCY: 1-2x/week  PT DURATION: 6 weeks  PLANNED INTERVENTIONS: 97110-Therapeutic exercises, 97530- Therapeutic activity, [id number]- Neuromuscular re-education, 97535- Self Care, 02859- Manual therapy, Patient/Family education, Balance training, Joint mobilization, Cryotherapy, and Moist heat  PLAN FOR NEXT SESSION: Progress LUE HEP, progress strengthening of LUE, incorporate balance SLS activities    10:33 AM, 11/27/23 Rosaria Settler, PT, DPT [facility] [place]

## 2023-12-03 ENCOUNTER — Ambulatory Visit (HOSPITAL_COMMUNITY): Attending: Internal Medicine | Admitting: Physical Therapy

## 2023-12-03 DIAGNOSIS — R29898 Other symptoms and signs involving the musculoskeletal system: Secondary | ICD-10-CM | POA: Diagnosis present

## 2023-12-03 DIAGNOSIS — R200 Anesthesia of skin: Secondary | ICD-10-CM | POA: Diagnosis present

## 2023-12-03 DIAGNOSIS — Z7409 Other reduced mobility: Secondary | ICD-10-CM | POA: Insufficient documentation

## 2023-12-03 DIAGNOSIS — Z789 Other specified health status: Secondary | ICD-10-CM | POA: Insufficient documentation

## 2023-12-03 NOTE — Therapy (Signed)
 OUTPATIENT PHYSICAL THERAPY NEURO TREATMENT   Patient Name: Brad Kirk MRN: [id number] DOB:[birth year], 78 y.o., male Today's Date: 12/03/2023    END OF SESSION:  PT End of Session - 12/03/23 1232     Visit Number 3    Date for PT Re-Evaluation 01/02/24    Authorization Type MEDICARE PART A AND B    Progress Note Due on Visit 10    PT Start Time 1152    PT Stop Time 1232    PT Time Calculation (min) 40 min    Activity Tolerance Patient tolerated treatment well    Behavior During Therapy WFL for tasks assessed/performed           Past Medical History:  Diagnosis Date   Hypertension    No past surgical history on file. Patient Active Problem List   Diagnosis Date Noted   CKD stage 3a, GFR 45-59 ml/min (HCC) 11/13/2023   Acute ischemic stroke (HCC) 11/12/2023   UNSPECIFIED VISUAL LOSS 06/01/2008   Essential hypertension 06/01/2008   Backache 06/01/2008   VERTIGO 06/01/2008   Headache 06/01/2008    PCP: No PCP REFERRING PROVIDER: Ricky Fines, MD ONSET DATE: Stroke occurred the 13th of August, last Wednesday   REFERRING DIAG: I63.9 (ICD-10-CM) - Acute ischemic stroke (HCC)  THERAPY DIAG:  No diagnosis found.  Rationale for Evaluation and Treatment: Rehabilitation  SUBJECTIVE:                                                                                                                                                                                             SUBJECTIVE STATEMENT: Pt reports he is doing better with Lt LE working better for him.  No pain or issues.     EVAL: Pt states he was watching tv and all of a sudden numbness went down left arm and left leg. Pt states the leg is back to normal now but the left arm is still numb and tingly.  Pt accompanied by: self  PERTINENT HISTORY:    PAIN:  Are you having pain? No  PRECAUTIONS: None  RED FLAGS: None   WEIGHT BEARING RESTRICTIONS: No  FALLS: Has patient fallen in last 6 months?  Yes. Number of falls 1, slid out of chair when stroke happened.  LIVING ENVIRONMENT: Lives with: lives alone Lives in: House/apartment Stairs: Yes: External: 3 steps; on right going up Has following equipment at home: None  PLOF: Independent  PATIENT GOALS: Pt would like to get back on the keyboard, pt states he would like to get some strength back in his legs, would like to be able to walk further.  OBJECTIVE:  Note: Objective measures were completed at Evaluation unless otherwise noted.  DIAGNOSTIC FINDINGS: CLINICAL DATA:  Weakness   EXAM: MRI HEAD WITHOUT CONTRAST   TECHNIQUE: Multiplanar, multiecho pulse sequences of the brain and surrounding structures were obtained without intravenous contrast.   COMPARISON:  CT/CT angiogram 11/12/2023   FINDINGS: MRI brain:   There is an acute infarct involving the right parietal cortex and right frontal parietal border zone. There are areas of petechial hemorrhage in the cortical infarct.   The signal in the brain parenchyma is normal.   The ventricles are normal.   No mass lesion.   There are normal flow signals in the carotid arteries and basilar artery.   No significant bone marrow signal abnormality.   No significant abnormality in the paranasal sinuses or soft tissues.   IMPRESSION: Acute ischemic infarct in the right parietal cortex and right frontal parietal border zone. There is petechial hemorrhage in the right parietal cortex  COGNITION: Overall cognitive status: Within functional limits for tasks assessed   SENSATION: Light touch: Impaired , left arm  COORDINATION: Finger to nose abnormal on LUE, heel to shin WFL   UPPER EXTREMITY ROM:  Active ROM Right eval Left eval  Shoulder flexion [facility]  Shoulder extension    Shoulder abduction [facility]  Shoulder adduction    Shoulder extension    Shoulder internal rotation    Shoulder external rotation    Elbow flexion    Elbow extension    Wrist  flexion    Wrist extension    Wrist ulnar deviation    Wrist radial deviation    Wrist pronation    Wrist supination     (Blank rows = not tested)  UPPER EXTREMITY MMT:  MMT Right eval Left eval  Shoulder flexion 4 3+  Shoulder extension    Shoulder abduction 4 3  Shoulder adduction    Shoulder extension    Shoulder internal rotation 4 3-  Shoulder external rotation 4 3-  Middle trapezius    Lower trapezius    Elbow flexion 4 4-  Elbow extension 4 4-  Wrist flexion    Wrist extension    Wrist ulnar deviation    Wrist radial deviation    Wrist pronation 4 3  Wrist supination 4 3  Grip strength 85 lb 65 lb   (Blank rows = not tested)   LOWER EXTREMITY MMT:    MMT Right Eval Left Eval  Hip flexion    Hip extension    Hip abduction    Hip adduction    Hip internal rotation    Hip external rotation    Knee flexion    Knee extension    Ankle dorsiflexion    Ankle plantarflexion    Ankle inversion    Ankle eversion    (Blank rows = not tested)   GAIT: Findings: WFL  FUNCTIONAL TESTS:  5 times sit to stand: 11.49 seconds Timed up and go (TUG): 9.28 seconds 2 minute walk test: 545 feet  SLS 11/21/23: R: 30 seconds  L: 30 seconds   Grip Strength:  R: 85 lb. avg of 3 trials L: 65 lb avg of 3 trials   Upper Extremity Function Scale: 11 / 80 = 13.8 %  TREATMENT DATE:   12/03/23 UBE 2'fwd, 2'bkwd level 1 Standing:  shoulder rows RTB 2X10  Shoulder extension RTB 2X10  Lt UE flexion RTB 2X10  Lt UE ER RTB 2X10  D1 Lt only RTB 2X10  D2 Lt only RTB 2X10 Nuts/bolt board clearing off top line onto bottom 1X Putty pink 5' rolling kneeding squeezing 15 beads removal from pink putty  11/27/23: Review of goals UEFS Grip strength testing ROM- WFL  Putty Squeezes, 2' Red/pink  Putty Pinch, 2' Red/pink Shoulder Rows, 15x,  RTB Shoulder Extension, 15x, RTB Shoulder IR/ER, RTB, LUE only, 10x UBE, 5' total, level 7 - 4' BUE, level 4 for 1' LUE only    11/21/2023  Evaluation: -ROM measured, Strength assessed, HEP prescribed, pt educated on prognosis, findings, and importance of HEP compliance if given.     PATIENT EDUCATION: Education details: Pt was educated on findings of PT evaluation, prognosis, frequency of therapy visits and rationale, attendance policy, and HEP if given.   Person educated: Patient Education method: Explanation, Verbal cues, and Handouts Education comprehension: verbalized understanding, verbal cues required, and needs further education  HOME EXERCISE PROGRAM: Access Code: [email or web address] URL: [email or web address] Date: 11/21/2023 Prepared by: Lang Ada  Exercises - Supine Bridge  - 1 x daily - 7 x weekly - 3 sets - 10 reps - 5 hold - Sit to Stand with Arms Crossed  - 1 x daily - 7 x weekly - 3 sets - 10 reps - Single Leg Stance  - 1 x daily - 7 x weekly - 1 sets - 3 reps - 30 hold Access Code: [email or web address] URL: [email or web address] Date: 11/27/2023 Prepared by: Rosaria Powell-Butler  Exercises - Putty Squeezes  - 2 x daily - 7 x weekly - 3 sets - 10 reps - Tip Pinch with Putty  - 2 x daily - 7 x weekly - 3 sets - 10 reps - Shoulder extension with resistance - Neutral  - 2 x daily - 7 x weekly - 3 sets - 10 reps - Shoulder External Rotation Reactive Isometrics  - 2 x daily - 7 x weekly - 3 sets - 10 reps - Shoulder Internal Rotation Reactive Isometrics  - 2 x daily - 7 x weekly - 3 sets - 10 reps - Standing Shoulder Row with Anchored Resistance  - 2 x daily - 7 x weekly - 3 sets - 10 reps   GOALS: Goals reviewed with patient? Yes  SHORT TERM GOALS: Target date: 12/12/23  Patient will demonstrate evidence of independence with individualized HEP and will report compliance for at least 3 days per week for optimized progression towards remaining therapy  goals. Baseline:  Goal status: INITIAL  2.  Patient will report a 25% improvement of function of LUE during ADL for improved quality of life. Baseline:  Goal status: INITIAL     LONG TERM GOALS: Target date: 01/02/24  Pt will demonstrate a an increase of at least 9 points on the UEFS for improved performance of community ambulation and ADL. Baseline: see objective Goal status: INITIAL  2.  Pt will improve 2 MWT by 50 feet in order to demonstrate improved functional ambulatory capacity in community setting.  Baseline: see objective Goal status: INITIAL  3.  Pt will demonstrate WFL ROM in left hand and LUE and coordination for increased mobility and maximal efficiency of LUE during ADLS. Baseline: see objective Goal status: INITIAL  4.  Pt will demonstrate at least 4-/5 MMT for left upper extremity for increased  strength during ADL and community ambulation. Baseline: see objective Goal status: INITIAL  5.  Pt will improve grip strength in LUE by 10lbs in order to improve functionality of LUE during functional activities. Baseline: see objective Goal status: INITIAL   ASSESSMENT:  CLINICAL IMPRESSION: Progressed Lt UE strengthening exercises today using red theraband.  Fine motor tasks included bolt removal/attachment, negotiation of putty with coordinating movements of fingers with activities.  Overall improvement with no c/o fatigue or pain, just reports of stinging/burning at times.  Patient will benefit from continued skilled physical therapy in order to address current deficits to return to PLOF.    EVAL: Patient is a 78 y.o. male who was seen today for physical therapy evaluation and treatment for I63.9 (ICD-10-CM) - Acute ischemic stroke (HCC).  Patient demonstrates decreased LUE sensation, strength and coordination deficits, good LE strength, no pain, and WFL balance. Patient also demonstrates no difficulty with ambulation during today's session over 500 feet on with  no significant gait deviations noted. Patient also demonstrates no issues with functional mobility. Pt does demonstrate deficits in LUE strength and coordination that is impacting his ADL. Patient requires education on role of PT, importance of HEP, and encourage to find primary care physician. Patient would benefit from skilled physical therapy for increased coordination of LUE, increased LUE strength for improved performance with ADLs and increased quality of life.   OBJECTIVE IMPAIRMENTS: decreased activity tolerance, decreased ROM, decreased strength, hypomobility, impaired flexibility, and impaired sensation.   ACTIVITY LIMITATIONS: carrying, lifting, reach over head, and hygiene/grooming  PARTICIPATION LIMITATIONS: meal prep, cleaning, laundry, driving, shopping, community activity, and yard work  PERSONAL FACTORS: Age, Fitness, Past/current experiences, and 1 comorbidity: stroke are also affecting patient's functional outcome.   REHAB POTENTIAL: Fair stroke/neurological  CLINICAL DECISION MAKING: Stable/uncomplicated  EVALUATION COMPLEXITY: Low  PLAN:  PT FREQUENCY: 1-2x/week  PT DURATION: 6 weeks  PLANNED INTERVENTIONS: 97110-Therapeutic exercises, 97530- Therapeutic activity, [id number]- Neuromuscular re-education, 97535- Self Care, [id number]- Manual therapy, Patient/Family education, Balance training, Joint mobilization, Cryotherapy, and Moist heat  PLAN FOR NEXT SESSION: Progress LUE HEP, progress strengthening of LUE, incorporate balance SLS activities   Greig KATHEE Fuse, PTA/CLT [facility] Ph: [phone]  12:32 PM, 12/03/23

## 2023-12-04 ENCOUNTER — Ambulatory Visit (HOSPITAL_COMMUNITY)

## 2023-12-04 ENCOUNTER — Encounter (HOSPITAL_COMMUNITY): Payer: Self-pay

## 2023-12-04 DIAGNOSIS — R29898 Other symptoms and signs involving the musculoskeletal system: Secondary | ICD-10-CM

## 2023-12-04 DIAGNOSIS — Z789 Other specified health status: Secondary | ICD-10-CM

## 2023-12-04 DIAGNOSIS — R200 Anesthesia of skin: Secondary | ICD-10-CM

## 2023-12-04 NOTE — Therapy (Signed)
 OUTPATIENT PHYSICAL THERAPY NEURO TREATMENT   Patient Name: Brad Kirk MRN: [id number] DOB:January 13, 1946, 78 y.o., male Today's Date: 12/04/2023    END OF SESSION:  PT End of Session - 12/04/23 1150     Visit Number 4    Number of Visits 12    Date for PT Re-Evaluation 01/01/24    Authorization Type MEDICARE PART A AND B    Progress Note Due on Visit 10    PT Start Time 1151    PT Stop Time 1230    PT Time Calculation (min) 39 min    Activity Tolerance Patient tolerated treatment well    Behavior During Therapy WFL for tasks assessed/performed           Past Medical History:  Diagnosis Date   Hypertension    History reviewed. No pertinent surgical history. Patient Active Problem List   Diagnosis Date Noted   CKD stage 3a, GFR 45-59 ml/min (HCC) 11/13/2023   Acute ischemic stroke (HCC) 11/12/2023   UNSPECIFIED VISUAL LOSS 06/01/2008   Essential hypertension 06/01/2008   Backache 06/01/2008   VERTIGO 06/01/2008   Headache 06/01/2008    PCP: No PCP REFERRING PROVIDER: Ricky Fines, MD ONSET DATE: Stroke occurred the 13th of August, last Wednesday   REFERRING DIAG: I63.9 (ICD-10-CM) - Acute ischemic stroke (HCC)  THERAPY DIAG:  Weakness of left upper extremity  Left upper extremity numbness  Impaired mobility and ADLs  Rationale for Evaluation and Treatment: Rehabilitation  SUBJECTIVE:                                                                                                                                                                                             SUBJECTIVE STATEMENT: Pt reports no pain in LUE. Reports pinch and grip getting better  EVAL: Pt states he was watching tv and all of a sudden numbness went down left arm and left leg. Pt states the leg is back to normal now but the left arm is still numb and tingly.  Pt accompanied by: self  PERTINENT HISTORY:    PAIN:  Are you having pain? No  PRECAUTIONS: None  RED  FLAGS: None   WEIGHT BEARING RESTRICTIONS: No  FALLS: Has patient fallen in last 6 months? Yes. Number of falls 1, slid out of chair when stroke happened.  LIVING ENVIRONMENT: Lives with: lives alone Lives in: House/apartment Stairs: Yes: External: 3 steps; on right going up Has following equipment at home: None  PLOF: Independent  PATIENT GOALS: Pt would like to get back on the keyboard, pt states he would like to get some strength back in  his legs, would like to be able to walk further.  OBJECTIVE:  Note: Objective measures were completed at Evaluation unless otherwise noted.  DIAGNOSTIC FINDINGS: CLINICAL DATA:  Weakness   EXAM: MRI HEAD WITHOUT CONTRAST   TECHNIQUE: Multiplanar, multiecho pulse sequences of the brain and surrounding structures were obtained without intravenous contrast.   COMPARISON:  CT/CT angiogram 11/12/2023   FINDINGS: MRI brain:   There is an acute infarct involving the right parietal cortex and right frontal parietal border zone. There are areas of petechial hemorrhage in the cortical infarct.   The signal in the brain parenchyma is normal.   The ventricles are normal.   No mass lesion.   There are normal flow signals in the carotid arteries and basilar artery.   No significant bone marrow signal abnormality.   No significant abnormality in the paranasal sinuses or soft tissues.   IMPRESSION: Acute ischemic infarct in the right parietal cortex and right frontal parietal border zone. There is petechial hemorrhage in the right parietal cortex  COGNITION: Overall cognitive status: Within functional limits for tasks assessed   SENSATION: Light touch: Impaired , left arm  COORDINATION: Finger to nose abnormal on LUE, heel to shin WFL   UPPER EXTREMITY ROM:  Active ROM Right eval Left eval  Shoulder flexion [facility]  Shoulder extension    Shoulder abduction [facility]  Shoulder adduction    Shoulder extension    Shoulder  internal rotation    Shoulder external rotation    Elbow flexion    Elbow extension    Wrist flexion    Wrist extension    Wrist ulnar deviation    Wrist radial deviation    Wrist pronation    Wrist supination     (Blank rows = not tested)  UPPER EXTREMITY MMT:  MMT Right eval Left eval  Shoulder flexion 4 3+  Shoulder extension    Shoulder abduction 4 3  Shoulder adduction    Shoulder extension    Shoulder internal rotation 4 3-  Shoulder external rotation 4 3-  Middle trapezius    Lower trapezius    Elbow flexion 4 4-  Elbow extension 4 4-  Wrist flexion    Wrist extension    Wrist ulnar deviation    Wrist radial deviation    Wrist pronation 4 3  Wrist supination 4 3  Grip strength 85 lb 65 lb   (Blank rows = not tested)   LOWER EXTREMITY MMT:    MMT Right Eval Left Eval  Hip flexion    Hip extension    Hip abduction    Hip adduction    Hip internal rotation    Hip external rotation    Knee flexion    Knee extension    Ankle dorsiflexion    Ankle plantarflexion    Ankle inversion    Ankle eversion    (Blank rows = not tested)   GAIT: Findings: WFL  FUNCTIONAL TESTS:  5 times sit to stand: 11.49 seconds Timed up and go (TUG): 9.28 seconds 2 minute walk test: 545 feet  SLS 11/21/23: R: 30 seconds  L: 30 seconds   Grip Strength:  R: 85 lb. avg of 3 trials L: 65 lb avg of 3 trials   Upper Extremity Function Scale: 11 / 80 = 13.8 %  TREATMENT DATE:  12/04/23: UBE, level 2, 1' with BUE, 4' with LUE only Seated: -Shoulder flexion, 4 lb dumb bells, 2x10, verbal cues for core engagement -Shoulder abduction, 4 lb,, 2x10, verbal cues for form  -Bicep curls, 10 lb, 2x10 each side -Tricep press up with handles, 3x10   -Wrist extension with 4 lb at table, 3x10  -Wrist flexion with 5 lb at table, 3x10 -Shuffling Colorful pins  to work on pincer grip/pinch, 8', first two fingers -9 hole peg activity, pincer grip with each finger and thumb to complete, 3 rounds  12/03/23 UBE 2'fwd, 2'bkwd level 1 Standing:  shoulder rows RTB 2X10  Shoulder extension RTB 2X10  Lt UE flexion RTB 2X10  Lt UE ER RTB 2X10  D1 Lt only RTB 2X10  D2 Lt only RTB 2X10 Nuts/bolt board clearing off top line onto bottom 1X Putty pink 5' rolling kneeding squeezing 15 beads removal from pink putty  11/27/23: Review of goals UEFS Grip strength testing ROM- WFL  Putty Squeezes, 2' Red/pink  Putty Pinch, 2' Red/pink Shoulder Rows, 15x, RTB Shoulder Extension, 15x, RTB Shoulder IR/ER, RTB, LUE only, 10x UBE, 5' total, level 7 - 4' BUE, level 4 for 1' LUE only    PATIENT EDUCATION: Education details: Pt was educated on findings of PT evaluation, prognosis, frequency of therapy visits and rationale, attendance policy, and HEP if given.   Person educated: Patient Education method: Explanation, Verbal cues, and Handouts Education comprehension: verbalized understanding, verbal cues required, and needs further education  HOME EXERCISE PROGRAM: Access Code: [email or web address] URL: [email or web address] Date: 11/21/2023 Prepared by: Lang Ada  Exercises - Supine Bridge  - 1 x daily - 7 x weekly - 3 sets - 10 reps - 5 hold - Sit to Stand with Arms Crossed  - 1 x daily - 7 x weekly - 3 sets - 10 reps - Single Leg Stance  - 1 x daily - 7 x weekly - 1 sets - 3 reps - 30 hold Access Code: [email or web address] URL: [email or web address] Date: 11/27/2023 Prepared by: Rosaria Powell-Butler  Exercises - Putty Squeezes  - 2 x daily - 7 x weekly - 3 sets - 10 reps - Tip Pinch with Putty  - 2 x daily - 7 x weekly - 3 sets - 10 reps - Shoulder extension with resistance - Neutral  - 2 x daily - 7 x weekly - 3 sets - 10 reps - Shoulder External Rotation Reactive Isometrics  - 2 x daily - 7 x weekly - 3 sets - 10 reps - Shoulder Internal  Rotation Reactive Isometrics  - 2 x daily - 7 x weekly - 3 sets - 10 reps - Standing Shoulder Row with Anchored Resistance  - 2 x daily - 7 x weekly - 3 sets - 10 reps   GOALS: Goals reviewed with patient? Yes  SHORT TERM GOALS: Target date: 12/12/23  Patient will demonstrate evidence of independence with individualized HEP and will report compliance for at least 3 days per week for optimized progression towards remaining therapy goals. Baseline:  Goal status: INITIAL  2.  Patient will report a 25% improvement of function of LUE during ADL for improved quality of life. Baseline:  Goal status: INITIAL     LONG TERM GOALS: Target date: 01/02/24  Pt will demonstrate a an increase of at least 9 points on the UEFS for improved performance of community ambulation and ADL. Baseline: see objective Goal status: INITIAL  2.  Pt will improve 2 MWT  by 50 feet in order to demonstrate improved functional ambulatory capacity in community setting.  Baseline: see objective Goal status: INITIAL  3.  Pt will demonstrate WFL ROM in left hand and LUE and coordination for increased mobility and maximal efficiency of LUE during ADLS. Baseline: see objective Goal status: INITIAL  4.  Pt will demonstrate at least 4-/5 MMT for left upper extremity for increased strength during ADL and community ambulation. Baseline: see objective Goal status: INITIAL  5.  Pt will improve grip strength in LUE by 10lbs in order to improve functionality of LUE during functional activities. Baseline: see objective Goal status: INITIAL   ASSESSMENT:  CLINICAL IMPRESSION: Patient arrives to session with reports of no pain and endorsing improvements with LUE use. Began session with UBE, focus on LUE only on increased resistance level. Followed with UE strengthening beginning at shoulder. Pt demo mild lag with LUE during shoulder extension exercises. Mild improvement with verbal cues for form and inc core engagement.  Remainder of session spent with strengthening elbow, wrist and grip strength. Patient demo difficulty with maintaining tricep press handle position with LUE due to weakness. PT guards for safety. Improvements noted with continuing activity. Pt demonstrates improved grip strength being able to maintain grip of dumb bells. Most difficulty with coordination and pincer grip during pin and 9 hole peg activities. Patient will benefit from continued skilled physical therapy in order to address current deficits to return to PLOF.    EVAL: Patient is a 78 y.o. male who was seen today for physical therapy evaluation and treatment for I63.9 (ICD-10-CM) - Acute ischemic stroke (HCC).  Patient demonstrates decreased LUE sensation, strength and coordination deficits, good LE strength, no pain, and WFL balance. Patient also demonstrates no difficulty with ambulation during today's session over 500 feet on with no significant gait deviations noted. Patient also demonstrates no issues with functional mobility. Pt does demonstrate deficits in LUE strength and coordination that is impacting his ADL. Patient requires education on role of PT, importance of HEP, and encourage to find primary care physician. Patient would benefit from skilled physical therapy for increased coordination of LUE, increased LUE strength for improved performance with ADLs and increased quality of life.   OBJECTIVE IMPAIRMENTS: decreased activity tolerance, decreased ROM, decreased strength, hypomobility, impaired flexibility, and impaired sensation.   ACTIVITY LIMITATIONS: carrying, lifting, reach over head, and hygiene/grooming  PARTICIPATION LIMITATIONS: meal prep, cleaning, laundry, driving, shopping, community activity, and yard work  PERSONAL FACTORS: Age, Fitness, Past/current experiences, and 1 comorbidity: stroke are also affecting patient's functional outcome.   REHAB POTENTIAL: Fair stroke/neurological  CLINICAL DECISION MAKING:  Stable/uncomplicated  EVALUATION COMPLEXITY: Low  PLAN:  PT FREQUENCY: 1-2x/week  PT DURATION: 6 weeks  PLANNED INTERVENTIONS: 97110-Therapeutic exercises, 97530- Therapeutic activity, [id number]- Neuromuscular re-education, 97535- Self Care, [id number]- Manual therapy, Patient/Family education, Balance training, Joint mobilization, Cryotherapy, and Moist heat  PLAN FOR NEXT SESSION: Progress LUE HEP, progress strengthening of LUE, incorporate balance SLS activities   12:39 PM, 12/04/23 Karyme Mcconathy Powell-Butler, PT, DPT [facility] [place]

## 2023-12-08 ENCOUNTER — Telehealth: Payer: Self-pay

## 2023-12-08 ENCOUNTER — Ambulatory Visit (HOSPITAL_COMMUNITY): Admitting: Physical Therapy

## 2023-12-08 DIAGNOSIS — R200 Anesthesia of skin: Secondary | ICD-10-CM

## 2023-12-08 DIAGNOSIS — R29898 Other symptoms and signs involving the musculoskeletal system: Secondary | ICD-10-CM | POA: Diagnosis not present

## 2023-12-08 DIAGNOSIS — Z7409 Other reduced mobility: Secondary | ICD-10-CM

## 2023-12-08 NOTE — Therapy (Signed)
 OUTPATIENT PHYSICAL THERAPY NEURO TREATMENT   Patient Name: Brad Kirk MRN: [id number] DOB:January 17, 1946, 78 y.o., male Today's Date: 12/08/2023    END OF SESSION:  PT End of Session - 12/08/23 1118     Visit Number 5    Number of Visits 12    Date for PT Re-Evaluation 01/01/24    Authorization Type MEDICARE PART A AND B    Progress Note Due on Visit 10    PT Start Time 1025    PT Stop Time 1103    PT Time Calculation (min) 38 min    Activity Tolerance Patient tolerated treatment well    Behavior During Therapy WFL for tasks assessed/performed            Past Medical History:  Diagnosis Date   Hypertension    No past surgical history on file. Patient Active Problem List   Diagnosis Date Noted   CKD stage 3a, GFR 45-59 ml/min (HCC) 11/13/2023   Acute ischemic stroke (HCC) 11/12/2023   UNSPECIFIED VISUAL LOSS 06/01/2008   Essential hypertension 06/01/2008   Backache 06/01/2008   VERTIGO 06/01/2008   Headache 06/01/2008    PCP: No PCP REFERRING PROVIDER: Ricky Fines, MD ONSET DATE: Stroke occurred the 13th of August, last Wednesday   REFERRING DIAG: I63.9 (ICD-10-CM) - Acute ischemic stroke (HCC)  THERAPY DIAG:  Weakness of left upper extremity  Left upper extremity numbness  Impaired mobility and ADLs  Rationale for Evaluation and Treatment: Rehabilitation  SUBJECTIVE:                                                                                                                                                                                             SUBJECTIVE STATEMENT: Pt arrived late for session today. Reports he is doing well overall.    EVAL: Pt states he was watching tv and all of a sudden numbness went down left arm and left leg. Pt states the leg is back to normal now but the left arm is still numb and tingly.  Pt accompanied by: self  PERTINENT HISTORY:    PAIN:  Are you having pain? No  PRECAUTIONS: None  RED  FLAGS: None   WEIGHT BEARING RESTRICTIONS: No  FALLS: Has patient fallen in last 6 months? Yes. Number of falls 1, slid out of chair when stroke happened.  LIVING ENVIRONMENT: Lives with: lives alone Lives in: House/apartment Stairs: Yes: External: 3 steps; on right going up Has following equipment at home: None  PLOF: Independent  PATIENT GOALS: Pt would like to get back on the keyboard, pt states he would like to get some  strength back in his legs, would like to be able to walk further.  OBJECTIVE:  Note: Objective measures were completed at Evaluation unless otherwise noted.  DIAGNOSTIC FINDINGS: CLINICAL DATA:  Weakness   EXAM: MRI HEAD WITHOUT CONTRAST   TECHNIQUE: Multiplanar, multiecho pulse sequences of the brain and surrounding structures were obtained without intravenous contrast.   COMPARISON:  CT/CT angiogram 11/12/2023   FINDINGS: MRI brain:   There is an acute infarct involving the right parietal cortex and right frontal parietal border zone. There are areas of petechial hemorrhage in the cortical infarct.   The signal in the brain parenchyma is normal.   The ventricles are normal.   No mass lesion.   There are normal flow signals in the carotid arteries and basilar artery.   No significant bone marrow signal abnormality.   No significant abnormality in the paranasal sinuses or soft tissues.   IMPRESSION: Acute ischemic infarct in the right parietal cortex and right frontal parietal border zone. There is petechial hemorrhage in the right parietal cortex  COGNITION: Overall cognitive status: Within functional limits for tasks assessed   SENSATION: Light touch: Impaired , left arm  COORDINATION: Finger to nose abnormal on LUE, heel to shin WFL   UPPER EXTREMITY ROM:  Active ROM Right eval Left eval  Shoulder flexion [facility]  Shoulder extension    Shoulder abduction [facility]  Shoulder adduction    Shoulder extension    Shoulder  internal rotation    Shoulder external rotation    Elbow flexion    Elbow extension    Wrist flexion    Wrist extension    Wrist ulnar deviation    Wrist radial deviation    Wrist pronation    Wrist supination     (Blank rows = not tested)  UPPER EXTREMITY MMT:  MMT Right eval Left eval  Shoulder flexion 4 3+  Shoulder extension    Shoulder abduction 4 3  Shoulder adduction    Shoulder extension    Shoulder internal rotation 4 3-  Shoulder external rotation 4 3-  Middle trapezius    Lower trapezius    Elbow flexion 4 4-  Elbow extension 4 4-  Wrist flexion    Wrist extension    Wrist ulnar deviation    Wrist radial deviation    Wrist pronation 4 3  Wrist supination 4 3  Grip strength 85 lb 65 lb   (Blank rows = not tested)   LOWER EXTREMITY MMT:    MMT Right Eval Left Eval  Hip flexion    Hip extension    Hip abduction    Hip adduction    Hip internal rotation    Hip external rotation    Knee flexion    Knee extension    Ankle dorsiflexion    Ankle plantarflexion    Ankle inversion    Ankle eversion    (Blank rows = not tested)   GAIT: Findings: WFL  FUNCTIONAL TESTS:  5 times sit to stand: 11.49 seconds Timed up and go (TUG): 9.28 seconds 2 minute walk test: 545 feet  SLS 11/21/23: R: 30 seconds  L: 30 seconds   Grip Strength:  R: 85 lb. avg of 3 trials L: 65 lb avg of 3 trials   Upper Extremity Function Scale: 11 / 80 = 13.8 %  TREATMENT DATE:  12/08/23: UBE, level 2, 2' with BUE, 2' with LUE only Standing: SLS max Rt: 5, Lt: 10  Tandem stance 30 each LE no UE  Vectors 3way kick 5X5 each LE with 1 UE assist Shoulder flexion, 4 lb dumb bells, 2x15, verbal cues for core engagement Shoulder abduction, 4 lb, 2x15 verbal cues for form  Seated: Bicep curls, 10 lb, 2x15 each side  Hammer curls 8lb 2X15 each  side  Wrist extension with 5 lb at table, 2x15  Wrist thumb up extension 5lb 2X15  Wrist flexion with 5 lb at table, 2x15  Clothespin moving, pinch grip (pinky red, ring green,middle blue, index black)  12/04/23: UBE, level 2, 1' with BUE, 4' with LUE only Seated: -Shoulder flexion, 4 lb dumb bells, 2x10, verbal cues for core engagement -Shoulder abduction, 4 lb,, 2x10, verbal cues for form  -Bicep curls, 10 lb, 2x10 each side -Tricep press up with handles, 3x10   -Wrist extension with 4 lb at table, 3x10  -Wrist flexion with 5 lb at table, 3x10 -Shuffling Colorful pins to work on pincer grip/pinch, 8', first two fingers -9 hole peg activity, pincer grip with each finger and thumb to complete, 3 rounds  12/03/23 UBE 2'fwd, 2'bkwd level 1 Standing:  shoulder rows RTB 2X10  Shoulder extension RTB 2X10  Lt UE flexion RTB 2X10  Lt UE ER RTB 2X10  D1 Lt only RTB 2X10  D2 Lt only RTB 2X10 Nuts/bolt board clearing off top line onto bottom 1X Putty pink 5' rolling kneeding squeezing 15 beads removal from pink putty  11/27/23: Review of goals UEFS Grip strength testing ROM- WFL  Putty Squeezes, 2' Red/pink  Putty Pinch, 2' Red/pink Shoulder Rows, 15x, RTB Shoulder Extension, 15x, RTB Shoulder IR/ER, RTB, LUE only, 10x UBE, 5' total, level 7 - 4' BUE, level 4 for 1' LUE only    PATIENT EDUCATION: Education details: Pt was educated on findings of PT evaluation, prognosis, frequency of therapy visits and rationale, attendance policy, and HEP if given.   Person educated: Patient Education method: Explanation, Verbal cues, and Handouts Education comprehension: verbalized understanding, verbal cues required, and needs further education  HOME EXERCISE PROGRAM: Access Code: [email or web address] URL: [email or web address] Date: 11/21/2023 Prepared by: Lang Ada Exercises - Supine Bridge  - 1 x daily - 7 x weekly - 3 sets - 10 reps - 5 hold - Sit to Stand with Arms Crossed  -  1 x daily - 7 x weekly - 3 sets - 10 reps - Single Leg Stance  - 1 x daily - 7 x weekly - 1 sets - 3 reps - 30 hold  Access Code: [email or web address] URL: [email or web address] Date: 11/27/2023 Prepared by: Rosaria Powell-Butler Exercises - Putty Squeezes  - 2 x daily - 7 x weekly - 3 sets - 10 reps - Tip Pinch with Putty  - 2 x daily - 7 x weekly - 3 sets - 10 reps - Shoulder extension with resistance - Neutral  - 2 x daily - 7 x weekly - 3 sets - 10 reps - Shoulder External Rotation Reactive Isometrics  - 2 x daily - 7 x weekly - 3 sets - 10 reps - Shoulder Internal Rotation Reactive Isometrics  - 2 x daily - 7 x weekly - 3 sets - 10 reps - Standing Shoulder Row with Anchored Resistance  - 2 x daily - 7 x weekly - 3 sets - 10 reps   GOALS: Goals reviewed with patient? Yes  SHORT TERM GOALS: Target date: 12/12/23  Patient will demonstrate evidence of independence with individualized HEP and will report compliance for at least 3 days per week for optimized progression towards remaining therapy goals. Baseline:  Goal status: INITIAL  2.  Patient will report a 25% improvement of function of LUE during ADL for improved quality of life. Baseline:  Goal status: INITIAL     LONG TERM GOALS: Target date: 01/02/24  Pt will demonstrate a an increase of at least 9 points on the UEFS for improved performance of community ambulation and ADL. Baseline: see objective Goal status: INITIAL  2.  Pt will improve 2 MWT by 50 feet in order to demonstrate improved functional ambulatory capacity in community setting.  Baseline: see objective Goal status: INITIAL  3.  Pt will demonstrate WFL ROM in left hand and LUE and coordination for increased mobility and maximal efficiency of LUE during ADLS. Baseline: see objective Goal status: INITIAL  4.  Pt will demonstrate at least 4-/5 MMT for left upper extremity for increased strength during ADL and community ambulation. Baseline: see  objective Goal status: INITIAL  5.  Pt will improve grip strength in LUE by 10lbs in order to improve functionality of LUE during functional activities. Baseline: see objective Goal status: INITIAL   ASSESSMENT:  CLINICAL IMPRESSION: Patient with late arrival today; completing HEP and doing well overall.  Began with UE warm up then worked on LE balance/stability exercises.  Able to do well with tandem stance but single leg stance with low control.  Added vectors to help improve LE stabilization.  Continued with UE and wrist strengthening exercises, increasing weight and/or reps where able.  Progressed shoulder strengthening to standing to also incorporate stability.  Improvements noted with continuing activity. Focused in on coordination and pincer grip with clothespin activity.  Pinky and ring finger weakest with pinch grip and coordination.  Pt would like to return to playing his keyboard with fingers being most challenging as far as coordination.  Patient will continue to benefit from continued skilled physical therapy in order to address current deficits to return to PLOF.    EVAL: Patient is a 78 y.o. male who was seen today for physical therapy evaluation and treatment for I63.9 (ICD-10-CM) - Acute ischemic stroke (HCC).  Patient demonstrates decreased LUE sensation, strength and coordination deficits, good LE strength, no pain, and WFL balance. Patient also demonstrates no difficulty with ambulation during today's session over 500 feet on with no significant gait deviations noted. Patient also demonstrates no issues with functional mobility. Pt does demonstrate deficits in LUE strength and coordination that is impacting his ADL. Patient requires education on role of PT, importance of HEP, and encourage to find primary care physician. Patient would benefit from skilled physical therapy for increased coordination of LUE, increased LUE strength for improved performance with ADLs and increased  quality of life.   OBJECTIVE IMPAIRMENTS: decreased activity tolerance, decreased ROM, decreased strength, hypomobility, impaired flexibility, and impaired sensation.   ACTIVITY LIMITATIONS: carrying, lifting, reach over head, and hygiene/grooming  PARTICIPATION LIMITATIONS: meal prep, cleaning, laundry, driving, shopping, community activity, and yard work  PERSONAL FACTORS: Age, Fitness, Past/current experiences, and 1 comorbidity: stroke are also affecting patient's functional outcome.   REHAB POTENTIAL: Fair stroke/neurological  CLINICAL DECISION MAKING: Stable/uncomplicated  EVALUATION COMPLEXITY: Low  PLAN:  PT FREQUENCY: 1-2x/week  PT DURATION: 6 weeks  PLANNED INTERVENTIONS: 97110-Therapeutic exercises, 97530- Therapeutic activity, [id number]- Neuromuscular re-education, 97535- Self Care, [id number]- Manual therapy, Patient/Family education, Balance  training, Joint mobilization, Cryotherapy, and Moist heat  PLAN FOR NEXT SESSION: Progress LUE HEP, progress strengthening of LUE. 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    11:18 AM, 12/08/23 Greig KATHEE Fuse, PTA/CLT [facility] Ph: [phone]

## 2023-12-08 NOTE — Telephone Encounter (Signed)
 f/u with pt per consultation with [employer] [facility] [facility] (Kerri S, Care Guide) and referral by way of [facility] was completed.    Verified with pt that he does not qualify for the [employer] [facility] [facility] due to him having Medicare A/B according to our [employer] system  -Assisted pt with getting connected with a PCP by providing contact to [facility] [phone] and [facility]-[phone]  -In addition to providing him with the Cardiology Specialist office  [facility]) contact information that attempted to reach out to him in August 2025

## 2023-12-11 ENCOUNTER — Encounter (HOSPITAL_COMMUNITY): Payer: Self-pay

## 2023-12-11 ENCOUNTER — Ambulatory Visit (HOSPITAL_COMMUNITY)

## 2023-12-11 DIAGNOSIS — Z789 Other specified health status: Secondary | ICD-10-CM

## 2023-12-11 DIAGNOSIS — R29898 Other symptoms and signs involving the musculoskeletal system: Secondary | ICD-10-CM | POA: Diagnosis not present

## 2023-12-11 DIAGNOSIS — R200 Anesthesia of skin: Secondary | ICD-10-CM

## 2023-12-11 NOTE — Therapy (Signed)
 OUTPATIENT PHYSICAL THERAPY NEURO TREATMENT   Patient Name: Brad Kirk MRN: [id number] DOB:07-Oct-1945, 78 y.o., male Today's Date: 12/11/2023    END OF SESSION:  PT End of Session - 12/11/23 1017     Visit Number 6    Number of Visits 12    Date for PT Re-Evaluation 01/01/24    Authorization Type MEDICARE PART A AND B    Progress Note Due on Visit 10    PT Start Time 1020    PT Stop Time 1100    PT Time Calculation (min) 40 min    Activity Tolerance Patient tolerated treatment well    Behavior During Therapy WFL for tasks assessed/performed            Past Medical History:  Diagnosis Date   Hypertension    History reviewed. No pertinent surgical history. Patient Active Problem List   Diagnosis Date Noted   CKD stage 3a, GFR 45-59 ml/min (HCC) 11/13/2023   Acute ischemic stroke (HCC) 11/12/2023   UNSPECIFIED VISUAL LOSS 06/01/2008   Essential hypertension 06/01/2008   Backache 06/01/2008   VERTIGO 06/01/2008   Headache 06/01/2008    PCP: No PCP REFERRING PROVIDER: Ricky Fines, MD ONSET DATE: Stroke occurred the 13th of August, last Wednesday   REFERRING DIAG: I63.9 (ICD-10-CM) - Acute ischemic stroke (HCC)  THERAPY DIAG:  Weakness of left upper extremity  Left upper extremity numbness  Impaired mobility and ADLs  Rationale for Evaluation and Treatment: Rehabilitation  SUBJECTIVE:                                                                                                                                                                                             SUBJECTIVE STATEMENT: Pt reports he has some increased numbness in LUE today. Reports its about 4/10 in intensity. Reports its been numb but more intense today when he woke up.    EVAL: Pt states he was watching tv and all of a sudden numbness went down left arm and left leg. Pt states the leg is back to normal now but the left arm is still numb and tingly.  Pt accompanied by:  self  PERTINENT HISTORY:    PAIN:  Are you having pain? No  PRECAUTIONS: None  RED FLAGS: None   WEIGHT BEARING RESTRICTIONS: No  FALLS: Has patient fallen in last 6 months? Yes. Number of falls 1, slid out of chair when stroke happened.  LIVING ENVIRONMENT: Lives with: lives alone Lives in: House/apartment Stairs: Yes: External: 3 steps; on right going up Has following equipment at home: None  PLOF: Independent  PATIENT GOALS: Pt  would like to get back on the keyboard, pt states he would like to get some strength back in his legs, would like to be able to walk further.  OBJECTIVE:  Note: Objective measures were completed at Evaluation unless otherwise noted.  DIAGNOSTIC FINDINGS: CLINICAL DATA:  Weakness   EXAM: MRI HEAD WITHOUT CONTRAST   TECHNIQUE: Multiplanar, multiecho pulse sequences of the brain and surrounding structures were obtained without intravenous contrast.   COMPARISON:  CT/CT angiogram 11/12/2023   FINDINGS: MRI brain:   There is an acute infarct involving the right parietal cortex and right frontal parietal border zone. There are areas of petechial hemorrhage in the cortical infarct.   The signal in the brain parenchyma is normal.   The ventricles are normal.   No mass lesion.   There are normal flow signals in the carotid arteries and basilar artery.   No significant bone marrow signal abnormality.   No significant abnormality in the paranasal sinuses or soft tissues.   IMPRESSION: Acute ischemic infarct in the right parietal cortex and right frontal parietal border zone. There is petechial hemorrhage in the right parietal cortex  COGNITION: Overall cognitive status: Within functional limits for tasks assessed   SENSATION: Light touch: Impaired , left arm  COORDINATION: Finger to nose abnormal on LUE, heel to shin WFL   UPPER EXTREMITY ROM:  Active ROM Right eval Left eval  Shoulder flexion [facility]  Shoulder  extension    Shoulder abduction [facility]  Shoulder adduction    Shoulder extension    Shoulder internal rotation    Shoulder external rotation    Elbow flexion    Elbow extension    Wrist flexion    Wrist extension    Wrist ulnar deviation    Wrist radial deviation    Wrist pronation    Wrist supination     (Blank rows = not tested)  UPPER EXTREMITY MMT:  MMT Right eval Left eval  Shoulder flexion 4 3+  Shoulder extension    Shoulder abduction 4 3  Shoulder adduction    Shoulder extension    Shoulder internal rotation 4 3-  Shoulder external rotation 4 3-  Middle trapezius    Lower trapezius    Elbow flexion 4 4-  Elbow extension 4 4-  Wrist flexion    Wrist extension    Wrist ulnar deviation    Wrist radial deviation    Wrist pronation 4 3  Wrist supination 4 3  Grip strength 85 lb 65 lb   (Blank rows = not tested)   LOWER EXTREMITY MMT:    MMT Right Eval Left Eval  Hip flexion    Hip extension    Hip abduction    Hip adduction    Hip internal rotation    Hip external rotation    Knee flexion    Knee extension    Ankle dorsiflexion    Ankle plantarflexion    Ankle inversion    Ankle eversion    (Blank rows = not tested)   GAIT: Findings: WFL  FUNCTIONAL TESTS:  5 times sit to stand: 11.49 seconds Timed up and go (TUG): 9.28 seconds 2 minute walk test: 545 feet  SLS 11/21/23: R: 30 seconds  L: 30 seconds   Grip Strength:  R: 85 lb. avg of 3 trials L: 65 lb avg of 3 trials   Upper Extremity Function Scale: 11 / 80 = 13.8 %  TREATMENT DATE:  12/11/23: UBE, level 3, 3' forward, 2' backward Nuts/bolt board clearing off top line onto bottom, 5', LUE only For grip strength, using grip tool to grasp 10 balls placing them into and out of box, 5', LUE only Shoulder rows BTB 3X10 Shoulder extension BTB 3X10 Bicep curls,  BTB, 3x10 Given next level of putty for home use (green)   12/08/23: UBE, level 2, 2' with BUE, 2' with LUE only Standing: SLS max Rt: 5, Lt: 10  Tandem stance 30 each LE no UE  Vectors 3way kick 5X5 each LE with 1 UE assist Shoulder flexion, 4 lb dumb bells, 2x15, verbal cues for core engagement Shoulder abduction, 4 lb, 2x15 verbal cues for form  Seated: Bicep curls, 10 lb, 2x15 each side  Hammer curls 8lb 2X15 each side  Wrist extension with 5 lb at table, 2x15  Wrist thumb up extension 5lb 2X15  Wrist flexion with 5 lb at table, 2x15  Clothespin moving, pinch grip (pinky red, ring green,middle blue, index black)  12/04/23: UBE, level 2, 1' with BUE, 4' with LUE only Seated: -Shoulder flexion, 4 lb dumb bells, 2x10, verbal cues for core engagement -Shoulder abduction, 4 lb,, 2x10, verbal cues for form  -Bicep curls, 10 lb, 2x10 each side -Tricep press up with handles, 3x10   -Wrist extension with 4 lb at table, 3x10  -Wrist flexion with 5 lb at table, 3x10 -Shuffling Colorful pins to work on pincer grip/pinch, 8', first two fingers -9 hole peg activity, pincer grip with each finger and thumb to complete, 3 rounds   PATIENT EDUCATION: Education details: Pt was educated on findings of PT evaluation, prognosis, frequency of therapy visits and rationale, attendance policy, and HEP if given.   Person educated: Patient Education method: Explanation, Verbal cues, and Handouts Education comprehension: verbalized understanding, verbal cues required, and needs further education  HOME EXERCISE PROGRAM: Access Code: [email or web address] URL: [email or web address] Date: 11/21/2023 Prepared by: Lang Ada Exercises - Supine Bridge  - 1 x daily - 7 x weekly - 3 sets - 10 reps - 5 hold - Sit to Stand with Arms Crossed  - 1 x daily - 7 x weekly - 3 sets - 10 reps - Single Leg Stance  - 1 x daily - 7 x weekly - 1 sets - 3 reps - 30 hold  Access Code: [email or web address] URL:  [email or web address] Date: 11/27/2023 Prepared by: Rosaria Powell-Butler Exercises - Putty Squeezes  - 2 x daily - 7 x weekly - 3 sets - 10 reps - Tip Pinch with Putty  - 2 x daily - 7 x weekly - 3 sets - 10 reps - Shoulder extension with resistance - Neutral  - 2 x daily - 7 x weekly - 3 sets - 10 reps - Shoulder External Rotation Reactive Isometrics  - 2 x daily - 7 x weekly - 3 sets - 10 reps - Shoulder Internal Rotation Reactive Isometrics  - 2 x daily - 7 x weekly - 3 sets - 10 reps - Standing Shoulder Row with Anchored Resistance  - 2 x daily - 7 x weekly - 3 sets - 10 reps   GOALS: Goals reviewed with patient? Yes  SHORT TERM GOALS: Target date: 12/12/23  Patient will demonstrate evidence of independence with individualized HEP and will report compliance for at least 3 days per week for optimized progression towards remaining therapy goals. Baseline:  Goal status: INITIAL  2.  Patient will report a 25% improvement of function of  LUE during ADL for improved quality of life. Baseline:  Goal status: INITIAL     LONG TERM GOALS: Target date: 01/02/24  Pt will demonstrate a an increase of at least 9 points on the UEFS for improved performance of community ambulation and ADL. Baseline: see objective Goal status: INITIAL  2.  Pt will improve 2 MWT by 50 feet in order to demonstrate improved functional ambulatory capacity in community setting.  Baseline: see objective Goal status: INITIAL  3.  Pt will demonstrate WFL ROM in left hand and LUE and coordination for increased mobility and maximal efficiency of LUE during ADLS. Baseline: see objective Goal status: INITIAL  4.  Pt will demonstrate at least 4-/5 MMT for left upper extremity for increased strength during ADL and community ambulation. Baseline: see objective Goal status: INITIAL  5.  Pt will improve grip strength in LUE by 10lbs in order to improve functionality of LUE during functional  activities. Baseline: see objective Goal status: INITIAL   ASSESSMENT:  CLINICAL IMPRESSION: Began on UBE for dynamic warm up, increased resistance level to 3. Followed with grip and grasp strengthening. Patient demonstrating improvements but most difficulty with pinky grip. During grasp/gripping activities, pt reports he feels like his arm is swelling and it gets harder for him to complete task. Educated pt on anatomy and physiology of lower arm and hand musculature. Remainder of session spent on general UE strengthening. Gave pt next level of putty grip to continue progressing towards goals. Patient will continue to benefit from continued skilled physical therapy in order to address current deficits to return to PLOF.    EVAL: Patient is a 78 y.o. male who was seen today for physical therapy evaluation and treatment for I63.9 (ICD-10-CM) - Acute ischemic stroke (HCC).  Patient demonstrates decreased LUE sensation, strength and coordination deficits, good LE strength, no pain, and WFL balance. Patient also demonstrates no difficulty with ambulation during today's session over 500 feet on with no significant gait deviations noted. Patient also demonstrates no issues with functional mobility. Pt does demonstrate deficits in LUE strength and coordination that is impacting his ADL. Patient requires education on role of PT, importance of HEP, and encourage to find primary care physician. Patient would benefit from skilled physical therapy for increased coordination of LUE, increased LUE strength for improved performance with ADLs and increased quality of life.   OBJECTIVE IMPAIRMENTS: decreased activity tolerance, decreased ROM, decreased strength, hypomobility, impaired flexibility, and impaired sensation.   ACTIVITY LIMITATIONS: carrying, lifting, reach over head, and hygiene/grooming  PARTICIPATION LIMITATIONS: meal prep, cleaning, laundry, driving, shopping, community activity, and yard  work  PERSONAL FACTORS: Age, Fitness, Past/current experiences, and 1 comorbidity: stroke are also affecting patient's functional outcome.   REHAB POTENTIAL: Fair stroke/neurological  CLINICAL DECISION MAKING: Stable/uncomplicated  EVALUATION COMPLEXITY: Low  PLAN:  PT FREQUENCY: 1-2x/week  PT DURATION: 6 weeks  PLANNED INTERVENTIONS: 97110-Therapeutic exercises, 97530- Therapeutic activity, [id number]- Neuromuscular re-education, 97535- Self Care, [id number]- Manual therapy, Patient/Family education, Balance training, Joint mobilization, Cryotherapy, and Moist heat  PLAN FOR NEXT SESSION: Progress LUE HEP, progress strengthening of LUE. 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    11:05 AM, 12/11/23 Rosaria Settler, PT, DPT [facility] [place]

## 2023-12-16 ENCOUNTER — Ambulatory Visit (HOSPITAL_COMMUNITY): Admitting: Physical Therapy

## 2023-12-16 DIAGNOSIS — R200 Anesthesia of skin: Secondary | ICD-10-CM

## 2023-12-16 DIAGNOSIS — R29898 Other symptoms and signs involving the musculoskeletal system: Secondary | ICD-10-CM

## 2023-12-16 DIAGNOSIS — Z789 Other specified health status: Secondary | ICD-10-CM

## 2023-12-16 NOTE — Therapy (Signed)
 OUTPATIENT PHYSICAL THERAPY NEURO TREATMENT   Patient Name: Brad Kirk MRN: [id number] DOB:1945-05-25, 78 y.o., male Today's Date: 12/16/2023    END OF SESSION:  PT End of Session - 12/16/23 0933     Visit Number 7    Number of Visits 12    Date for PT Re-Evaluation 01/01/24    Authorization Type MEDICARE PART A AND B    Progress Note Due on Visit 10    PT Start Time 0932    PT Stop Time 1012    PT Time Calculation (min) 40 min    Activity Tolerance Patient tolerated treatment well    Behavior During Therapy WFL for tasks assessed/performed            Past Medical History:  Diagnosis Date   Hypertension    No past surgical history on file. Patient Active Problem List   Diagnosis Date Noted   CKD stage 3a, GFR 45-59 ml/min (HCC) 11/13/2023   Acute ischemic stroke (HCC) 11/12/2023   UNSPECIFIED VISUAL LOSS 06/01/2008   Essential hypertension 06/01/2008   Backache 06/01/2008   VERTIGO 06/01/2008   Headache 06/01/2008    PCP: No PCP REFERRING PROVIDER: Ricky Fines, MD ONSET DATE: Stroke occurred the 13th of August, last Wednesday   REFERRING DIAG: I63.9 (ICD-10-CM) - Acute ischemic stroke (HCC)  THERAPY DIAG:  Weakness of left upper extremity  Left upper extremity numbness  Impaired mobility and ADLs  Rationale for Evaluation and Treatment: Rehabilitation  SUBJECTIVE:                                                                                                                                                                                             SUBJECTIVE STATEMENT: Pt states he walked 2 miles to therapy today but has someone picking him up.  States he did well with the walk, no issues, balance good now and fingers are getting stronger to play the keyboard. No pain today but still with numbness along ulnar side of Lt hand.    EVAL: Pt states he was watching tv and all of a sudden numbness went down left arm and left leg. Pt states the  leg is back to normal now but the left arm is still numb and tingly.  Pt accompanied by: self  PERTINENT HISTORY:    PAIN:  Are you having pain? No  PRECAUTIONS: None  RED FLAGS: None   WEIGHT BEARING RESTRICTIONS: No  FALLS: Has patient fallen in last 6 months? Yes. Number of falls 1, slid out of chair when stroke happened.  LIVING ENVIRONMENT: Lives with: lives alone Lives in: House/apartment  Stairs: Yes: External: 3 steps; on right going up Has following equipment at home: None  PLOF: Independent  PATIENT GOALS: Pt would like to get back on the keyboard, pt states he would like to get some strength back in his legs, would like to be able to walk further.  OBJECTIVE:  Note: Objective measures were completed at Evaluation unless otherwise noted.  DIAGNOSTIC FINDINGS: CLINICAL DATA:  Weakness   EXAM: MRI HEAD WITHOUT CONTRAST   TECHNIQUE: Multiplanar, multiecho pulse sequences of the brain and surrounding structures were obtained without intravenous contrast.   COMPARISON:  CT/CT angiogram 11/12/2023   FINDINGS: MRI brain:   There is an acute infarct involving the right parietal cortex and right frontal parietal border zone. There are areas of petechial hemorrhage in the cortical infarct.   The signal in the brain parenchyma is normal.   The ventricles are normal.   No mass lesion.   There are normal flow signals in the carotid arteries and basilar artery.   No significant bone marrow signal abnormality.   No significant abnormality in the paranasal sinuses or soft tissues.   IMPRESSION: Acute ischemic infarct in the right parietal cortex and right frontal parietal border zone. There is petechial hemorrhage in the right parietal cortex  COGNITION: Overall cognitive status: Within functional limits for tasks assessed   SENSATION: Light touch: Impaired , left arm  COORDINATION: Finger to nose abnormal on LUE, heel to shin WFL   UPPER  EXTREMITY ROM:  Active ROM Right eval Left eval  Shoulder flexion [facility]  Shoulder extension    Shoulder abduction [facility]  Shoulder adduction    Shoulder extension    Shoulder internal rotation    Shoulder external rotation    Elbow flexion    Elbow extension    Wrist flexion    Wrist extension    Wrist ulnar deviation    Wrist radial deviation    Wrist pronation    Wrist supination     (Blank rows = not tested)  UPPER EXTREMITY MMT:  MMT Right eval Left eval  Shoulder flexion 4 3+  Shoulder extension    Shoulder abduction 4 3  Shoulder adduction    Shoulder extension    Shoulder internal rotation 4 3-  Shoulder external rotation 4 3-  Middle trapezius    Lower trapezius    Elbow flexion 4 4-  Elbow extension 4 4-  Wrist flexion    Wrist extension    Wrist ulnar deviation    Wrist radial deviation    Wrist pronation 4 3  Wrist supination 4 3  Grip strength 85 lb 65 lb   (Blank rows = not tested)   LOWER EXTREMITY MMT:    MMT Right Eval Left Eval  Hip flexion    Hip extension    Hip abduction    Hip adduction    Hip internal rotation    Hip external rotation    Knee flexion    Knee extension    Ankle dorsiflexion    Ankle plantarflexion    Ankle inversion    Ankle eversion    (Blank rows = not tested)   GAIT: Findings: WFL  FUNCTIONAL TESTS:  5 times sit to stand: 11.49 seconds Timed up and go (TUG): 9.28 seconds 2 minute walk test: 545 feet  SLS 11/21/23: R: 30 seconds  L: 30 seconds   Grip Strength:  R: 85 lb. avg of 3 trials L: 65 lb avg of 3 trials  Upper Extremity Function Scale: 11 / 80 = 13.8 %                                                                                                                              TREATMENT DATE:   12/16/23: UBE, level 3, 3' forward, 3' backward Standing:  shoulder flexion 4# 2X10  Shoulder abduction 4# 2X10 Shoulder rows BTB 3X10 Shoulder extension BTB 3X10 Bicep curls, BTB,  3x10 Seated: Nuts/bolt board clearing off top line onto bottom 1X alternating finger pinch with thumb, LUE only For grip strength, using grip tool to grasp (silver coil at position #2) 10 large wooden beads, 5 medium size 2Xng them into and out of box, 5', LUE only  Red finger Digi-flex 10X each  12/11/23: UBE, level 3, 3' forward, 2' backward Nuts/bolt board clearing off top line onto bottom, 5', LUE only For grip strength, using grip tool to grasp 10 balls placing them into and out of box, 5', LUE only Shoulder rows BTB 3X10 Shoulder extension BTB 3X10 Bicep curls, BTB, 3x10 Given next level of putty for home use (green)   12/08/23: UBE, level 2, 2' with BUE, 2' with LUE only Standing: SLS max Rt: 5, Lt: 10  Tandem stance 30 each LE no UE  Vectors 3way kick 5X5 each LE with 1 UE assist Shoulder flexion, 4 lb dumb bells, 2x15, verbal cues for core engagement Shoulder abduction, 4 lb, 2x15 verbal cues for form  Seated: Bicep curls, 10 lb, 2x15 each side  Hammer curls 8lb 2X15 each side  Wrist extension with 5 lb at table, 2x15  Wrist thumb up extension 5lb 2X15  Wrist flexion with 5 lb at table, 2x15  Clothespin moving, pinch grip (pinky red, ring green,middle blue, index black)  12/04/23: UBE, level 2, 1' with BUE, 4' with LUE only Seated: -Shoulder flexion, 4 lb dumb bells, 2x10, verbal cues for core engagement -Shoulder abduction, 4 lb,, 2x10, verbal cues for form  -Bicep curls, 10 lb, 2x10 each side -Tricep press up with handles, 3x10   -Wrist extension with 4 lb at table, 3x10  -Wrist flexion with 5 lb at table, 3x10 -Shuffling Colorful pins to work on pincer grip/pinch, 8', first two fingers -9 hole peg activity, pincer grip with each finger and thumb to complete, 3 rounds   PATIENT EDUCATION: Education details: Pt was educated on findings of PT evaluation, prognosis, frequency of therapy visits and rationale, attendance policy, and HEP if given.   Person educated:  Patient Education method: Explanation, Verbal cues, and Handouts Education comprehension: verbalized understanding, verbal cues required, and needs further education  HOME EXERCISE PROGRAM: Access Code: [email or web address] URL: [email or web address] Date: 11/21/2023 Prepared by: Lang Ada Exercises - Supine Bridge  - 1 x daily - 7 x weekly - 3 sets - 10 reps - 5 hold - Sit to Stand with Arms Crossed  - 1 x daily - 7 x weekly - 3 sets -  10 reps - Single Leg Stance  - 1 x daily - 7 x weekly - 1 sets - 3 reps - 30 hold  Access Code: [email or web address] URL: [email or web address] Date: 11/27/2023 Prepared by: Rosaria Powell-Butler Exercises - Putty Squeezes  - 2 x daily - 7 x weekly - 3 sets - 10 reps - Tip Pinch with Putty  - 2 x daily - 7 x weekly - 3 sets - 10 reps - Shoulder extension with resistance - Neutral  - 2 x daily - 7 x weekly - 3 sets - 10 reps - Shoulder External Rotation Reactive Isometrics  - 2 x daily - 7 x weekly - 3 sets - 10 reps - Shoulder Internal Rotation Reactive Isometrics  - 2 x daily - 7 x weekly - 3 sets - 10 reps - Standing Shoulder Row with Anchored Resistance  - 2 x daily - 7 x weekly - 3 sets - 10 reps   GOALS: Goals reviewed with patient? Yes  SHORT TERM GOALS: Target date: 12/12/23  Patient will demonstrate evidence of independence with individualized HEP and will report compliance for at least 3 days per week for optimized progression towards remaining therapy goals. Baseline:  Goal status: INITIAL  2.  Patient will report a 25% improvement of function of LUE during ADL for improved quality of life. Baseline:  Goal status: INITIAL     LONG TERM GOALS: Target date: 01/02/24  Pt will demonstrate a an increase of at least 9 points on the UEFS for improved performance of community ambulation and ADL. Baseline: see objective Goal status: INITIAL  2.  Pt will improve 2 MWT by 50 feet in order to demonstrate improved functional ambulatory  capacity in community setting.  Baseline: see objective Goal status: INITIAL  3.  Pt will demonstrate WFL ROM in left hand and LUE and coordination for increased mobility and maximal efficiency of LUE during ADLS. Baseline: see objective Goal status: INITIAL  4.  Pt will demonstrate at least 4-/5 MMT for left upper extremity for increased strength during ADL and community ambulation. Baseline: see objective Goal status: INITIAL  5.  Pt will improve grip strength in LUE by 10lbs in order to improve functionality of LUE during functional activities. Baseline: see objective Goal status: INITIAL   ASSESSMENT:  CLINICAL IMPRESSION: Conitnued with focus on improving digit coordination, grip and grasp strength of Lt UE.  Bead gripping task much more difficult with smaller beads.   Grasp fatigue with need for rest breaks during task.  Alternated fingers with bolt activity today with pinky and ring finger being most difficult.  Pt with overall improvements in coordination, however still with early fatigue needing rest breaks.  Instructed with thenar stretch also and encouraged to complete self massage to this area to help with discomfort/cramping.  Patient will continue to benefit from continued skilled physical therapy in order to address current deficits to return to PLOF.  EVAL: Patient is a 78 y.o. male who was seen today for physical therapy evaluation and treatment for I63.9 (ICD-10-CM) - Acute ischemic stroke (HCC).  Patient demonstrates decreased LUE sensation, strength and coordination deficits, good LE strength, no pain, and WFL balance. Patient also demonstrates no difficulty with ambulation during today's session over 500 feet on with no significant gait deviations noted. Patient also demonstrates no issues with functional mobility. Pt does demonstrate deficits in LUE strength and coordination that is impacting his ADL. Patient requires education on role of PT, importance of HEP, and  encourage to find primary care physician. Patient would benefit from skilled physical therapy for increased coordination of LUE, increased LUE strength for improved performance with ADLs and increased quality of life.   OBJECTIVE IMPAIRMENTS: decreased activity tolerance, decreased ROM, decreased strength, hypomobility, impaired flexibility, and impaired sensation.   ACTIVITY LIMITATIONS: carrying, lifting, reach over head, and hygiene/grooming  PARTICIPATION LIMITATIONS: meal prep, cleaning, laundry, driving, shopping, community activity, and yard work  PERSONAL FACTORS: Age, Fitness, Past/current experiences, and 1 comorbidity: stroke are also affecting patient's functional outcome.   REHAB POTENTIAL: Fair stroke/neurological  CLINICAL DECISION MAKING: Stable/uncomplicated  EVALUATION COMPLEXITY: Low  PLAN:  PT FREQUENCY: 1-2x/week  PT DURATION: 6 weeks  PLANNED INTERVENTIONS: 97110-Therapeutic exercises, 97530- Therapeutic activity, [id number]- Neuromuscular re-education, 97535- Self Care, [id number]- Manual therapy, Patient/Family education, Balance training, Joint mobilization, Cryotherapy, and Moist heat  PLAN FOR NEXT SESSION: Progress LUE HEP, progress strengthening of LUE. 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    10:17 AM, 12/16/23 Greig KATHEE Fuse, PTA/CLT [facility] Ph: [phone]

## 2023-12-19 ENCOUNTER — Encounter (HOSPITAL_COMMUNITY): Payer: Self-pay

## 2023-12-19 ENCOUNTER — Ambulatory Visit (HOSPITAL_COMMUNITY)

## 2023-12-19 DIAGNOSIS — R29898 Other symptoms and signs involving the musculoskeletal system: Secondary | ICD-10-CM | POA: Diagnosis not present

## 2023-12-19 DIAGNOSIS — R200 Anesthesia of skin: Secondary | ICD-10-CM

## 2023-12-19 DIAGNOSIS — Z789 Other specified health status: Secondary | ICD-10-CM

## 2023-12-19 NOTE — Progress Notes (Signed)
 Brad Kirk

## 2023-12-19 NOTE — Therapy (Signed)
 OUTPATIENT PHYSICAL THERAPY NEURO TREATMENT  PHYSICAL THERAPY DISCHARGE SUMMARY  Visits from Start of Care: 7  Current functional level related to goals / functional outcomes: WFL   Remaining deficits: not tested due to pt being able to walk to clinic 2 miles last session.   Education / Equipment: Pt educated on importance of HEP compliance and encouraged to take advantage of community resources.    Patient agrees to discharge. Patient goals were partially met. Patient is being discharged due to being pleased with the current functional level.  Patient Name: Brad Kirk MRN: [id number] DOB:[birth year], 78 y.o., male Today's Date: 12/19/2023    END OF SESSION:  PT End of Session - 12/19/23 1016     Visit Number 8    Number of Visits 12    Date for Recertification  01/01/24    Authorization Type MEDICARE PART A AND B    Progress Note Due on Visit 10    PT Start Time 1016    PT Stop Time 1055    PT Time Calculation (min) 39 min    Activity Tolerance Patient tolerated treatment well    Behavior During Therapy WFL for tasks assessed/performed             Past Medical History:  Diagnosis Date   Hypertension    History reviewed. No pertinent surgical history. Patient Active Problem List   Diagnosis Date Noted   CKD stage 3a, GFR 45-59 ml/min (HCC) 11/13/2023   Acute ischemic stroke (HCC) 11/12/2023   UNSPECIFIED VISUAL LOSS 06/01/2008   Essential hypertension 06/01/2008   Backache 06/01/2008   VERTIGO 06/01/2008   Headache 06/01/2008    PCP: No PCP REFERRING PROVIDER: Ricky Fines, MD ONSET DATE: Stroke occurred the 13th of August, last Wednesday   REFERRING DIAG: I63.9 (ICD-10-CM) - Acute ischemic stroke (HCC)  THERAPY DIAG:  Weakness of left upper extremity  Left upper extremity numbness  Impaired mobility and ADLs  Rationale for Evaluation and Treatment: Rehabilitation  SUBJECTIVE:                                                                                                                                                                                              SUBJECTIVE STATEMENT: Pt states he had a ride today although he wanted to walk. Pt states that his LUE has improved a lot since the start of therapy, no difficulty with functional task except to some advanced fine motor skills due to abnormal numbness on ulnar side of palm and pinky finger. Pt states he feels he is ready to complete HEP independently at home.  EVAL: Pt states he was watching tv and all of a sudden numbness went down left arm and left leg. Pt states the leg is back to normal now but the left arm is still numb and tingly.  Pt accompanied by: self  PERTINENT HISTORY:    PAIN:  Are you having pain? No  PRECAUTIONS: None  RED FLAGS: None   WEIGHT BEARING RESTRICTIONS: No  FALLS: Has patient fallen in last 6 months? Yes. Number of falls 1, slid out of chair when stroke happened.  LIVING ENVIRONMENT: Lives with: lives alone Lives in: House/apartment Stairs: Yes: External: 3 steps; on right going up Has following equipment at home: None  PLOF: Independent  PATIENT GOALS: Pt would like to get back on the keyboard, pt states he would like to get some strength back in his legs, would like to be able to walk further.  OBJECTIVE:  Note: Objective measures were completed at Evaluation unless otherwise noted.  DIAGNOSTIC FINDINGS: CLINICAL DATA:  Weakness   EXAM: MRI HEAD WITHOUT CONTRAST   TECHNIQUE: Multiplanar, multiecho pulse sequences of the brain and surrounding structures were obtained without intravenous contrast.   COMPARISON:  CT/CT angiogram 11/12/2023   FINDINGS: MRI brain:   There is an acute infarct involving the right parietal cortex and right frontal parietal border zone. There are areas of petechial hemorrhage in the cortical infarct.   The signal in the brain parenchyma is normal.   The ventricles are  normal.   No mass lesion.   There are normal flow signals in the carotid arteries and basilar artery.   No significant bone marrow signal abnormality.   No significant abnormality in the paranasal sinuses or soft tissues.   IMPRESSION: Acute ischemic infarct in the right parietal cortex and right frontal parietal border zone. There is petechial hemorrhage in the right parietal cortex  COGNITION: Overall cognitive status: Within functional limits for tasks assessed   SENSATION: Light touch: Impaired , left arm  COORDINATION: Finger to nose abnormal on LUE, heel to shin WFL   UPPER EXTREMITY ROM:  Active ROM Right eval Left eval  Shoulder flexion [facility]  Shoulder extension    Shoulder abduction [facility]  Shoulder adduction    Shoulder extension    Shoulder internal rotation    Shoulder external rotation    Elbow flexion    Elbow extension    Wrist flexion    Wrist extension    Wrist ulnar deviation    Wrist radial deviation    Wrist pronation    Wrist supination     (Blank rows = not tested)  UPPER EXTREMITY MMT:  MMT Right eval Left eval Left 12/19/23  Shoulder flexion 4 3+ 4+  Shoulder extension     Shoulder abduction 4 3 4+  Shoulder adduction     Shoulder extension     Shoulder internal rotation 4 3- 4+  Shoulder external rotation 4 3- 4+  Middle trapezius     Lower trapezius     Elbow flexion 4 4- 4+  Elbow extension 4 4- 4+  Wrist flexion     Wrist extension     Wrist ulnar deviation     Wrist radial deviation     Wrist pronation 4 3 4   Wrist supination 4 3 4   Grip strength 85 lb 65 lb 88lb   (Blank rows = not tested)   LOWER EXTREMITY MMT:    MMT Right Eval Left Eval  Hip flexion  Hip extension    Hip abduction    Hip adduction    Hip internal rotation    Hip external rotation    Knee flexion    Knee extension    Ankle dorsiflexion    Ankle plantarflexion    Ankle inversion    Ankle eversion    (Blank rows = not  tested)   GAIT: Findings: WFL  FUNCTIONAL TESTS:  5 times sit to stand: 11.49 seconds Timed up and go (TUG): 9.28 seconds 2 minute walk test: 545 feet  SLS 11/21/23: R: 30 seconds  L: 30 seconds   Grip Strength:  R: 85 lb. avg of 3 trials L: 65 lb avg of 3 trials   Upper Extremity Function Scale: 11 / 80 = 13.8 %                      12/19/23: UEFS 1 / 80 = 1.3%                                                                                                          TREATMENT DATE:  12/19/2023  Discharge note: goals tracked, strength/ROM assessed, UEFS, grip strength tested, pt educated on importance of continuing HEP for continued strengthening of LUE Therapeutic Exercise: -UBE, level 5 resistance, 4 minutes, 2 fwd/2 bwd, pt cued for increased pace -Lat pull down, 2 sets of 10 reps, plate 6>7, pt cued for eccentric control -Pallof press with BTB on web slide chest level, 1 set of 10 rep bilaterally -Standing shoulder rows/exts, 2 sets of 10 reps, bilaterally, pt cued for eccentric control especially left side -STS with single arm shoulder press bilateral only with 10lb KB, pt cued for controlling of weight, 2 set of 10 reps -Body blade horizontal at 90 degrees of shoulder flexion, 1 bout each variation 30 second bouts     PATIENT EDUCATION: Education details: Pt was educated on findings of PT evaluation, prognosis, frequency of therapy visits and rationale, attendance policy, and HEP if given.   Person educated: Patient Education method: Explanation, Verbal cues, and Handouts Education comprehension: verbalized understanding, verbal cues required, and needs further education  HOME EXERCISE PROGRAM: Access Code: [email or web address] URL: [email or web address] Date: 11/21/2023 Prepared by: Lang Ada Exercises - Supine Bridge  - 1 x daily - 7 x weekly - 3 sets - 10 reps - 5 hold - Sit to Stand with Arms Crossed  - 1 x daily - 7 x weekly - 3 sets - 10 reps - Single  Leg Stance  - 1 x daily - 7 x weekly - 1 sets - 3 reps - 30 hold  Access Code: [email or web address] URL: [email or web address] Date: 11/27/2023 Prepared by: Rosaria Powell-Butler Exercises - Putty Squeezes  - 2 x daily - 7 x weekly - 3 sets - 10 reps - Tip Pinch with Putty  - 2 x daily - 7 x weekly - 3 sets - 10 reps - Shoulder extension with resistance - Neutral  - 2 x daily - 7 x weekly - 3 sets - 10  reps - Shoulder External Rotation Reactive Isometrics  - 2 x daily - 7 x weekly - 3 sets - 10 reps - Shoulder Internal Rotation Reactive Isometrics  - 2 x daily - 7 x weekly - 3 sets - 10 reps - Standing Shoulder Row with Anchored Resistance  - 2 x daily - 7 x weekly - 3 sets - 10 reps   GOALS: Goals reviewed with patient? Yes  SHORT TERM GOALS: Target date: 12/12/23  Patient will demonstrate evidence of independence with individualized HEP and will report compliance for at least 3 days per week for optimized progression towards remaining therapy goals. Baseline:  Goal status: MET  2.  Patient will report a 25% improvement of function of LUE during ADL for improved quality of life. Baseline:  Goal status: MET     LONG TERM GOALS: Target date: 01/02/24  Pt will demonstrate a an increase of at least 9 points on the UEFS for improved performance of community ambulation and ADL. Baseline: see objective Goal status: MET  2.  Pt will improve 2 MWT by 50 feet in order to demonstrate improved functional ambulatory capacity in community setting.  Baseline: see objective Goal status: INITIAL  3.  Pt will demonstrate WFL ROM in left hand and LUE and coordination for increased mobility and maximal efficiency of LUE during ADLS. Baseline: see objective Goal status: MET  4.  Pt will demonstrate at least 4-/5 MMT for left upper extremity for increased strength during ADL and community ambulation. Baseline: see objective Goal status: MET  5.  Pt will improve grip strength in LUE by  10lbs in order to improve functionality of LUE during functional activities. Baseline: see objective Goal status: MET   ASSESSMENT:  CLINICAL IMPRESSION: Patient continues to demonstrate increased LUE strength, WFL gait quality and balance. Patient also demonstrates increased endurance with aerobic based exercise during today's session on UE bike. Patient able to continue dynamic balance and core activation exercises today with paloff press and body blade activities, good performance with verbal cueing. Patient to be discharged from this episode of care due to achieving majority of goals and pt being satisfied with current level of function.   OBJECTIVE IMPAIRMENTS: decreased activity tolerance, decreased ROM, decreased strength, hypomobility, impaired flexibility, and impaired sensation.   ACTIVITY LIMITATIONS: carrying, lifting, reach over head, and hygiene/grooming  PARTICIPATION LIMITATIONS: meal prep, cleaning, laundry, driving, shopping, community activity, and yard work  PERSONAL FACTORS: Age, Fitness, Past/current experiences, and 1 comorbidity: stroke are also affecting patient's functional outcome.   REHAB POTENTIAL: Fair stroke/neurological  CLINICAL DECISION MAKING: Stable/uncomplicated  EVALUATION COMPLEXITY: Low  PLAN:  PT FREQUENCY: 1-2x/week  PT DURATION: 6 weeks  PLANNED INTERVENTIONS: 97110-Therapeutic exercises, 97530- Therapeutic activity, [id number]- Neuromuscular re-education, 97535- Self Care, [id number]- Manual therapy, Patient/Family education, Balance training, Joint mobilization, Cryotherapy, and Moist heat  PLAN FOR NEXT SESSION: Discharged   Lang Ada, PT, DPT [facility] Office: [phone] 11:11 AM, 12/19/23

## 2023-12-23 ENCOUNTER — Ambulatory Visit (HOSPITAL_COMMUNITY)

## 2023-12-26 ENCOUNTER — Ambulatory Visit (HOSPITAL_COMMUNITY)

## 2023-12-30 ENCOUNTER — Ambulatory Visit (HOSPITAL_COMMUNITY)

## 2024-01-01 ENCOUNTER — Ambulatory Visit (HOSPITAL_COMMUNITY)

## 2024-02-25 ENCOUNTER — Ambulatory Visit: Admitting: Neurology

## 2024-02-25 ENCOUNTER — Telehealth: Payer: Self-pay | Admitting: Neurology

## 2024-02-25 ENCOUNTER — Encounter: Payer: Self-pay | Admitting: Neurology

## 2024-02-25 VITALS — BP 195/97 | HR 63 | Ht 71.0 in | Wt 154.6 lb

## 2024-02-25 DIAGNOSIS — R29898 Other symptoms and signs involving the musculoskeletal system: Secondary | ICD-10-CM

## 2024-02-25 DIAGNOSIS — E7849 Other hyperlipidemia: Secondary | ICD-10-CM

## 2024-02-25 DIAGNOSIS — I63031 Cerebral infarction due to thrombosis of right carotid artery: Secondary | ICD-10-CM | POA: Diagnosis not present

## 2024-02-25 NOTE — Progress Notes (Addendum)
 [facility] [address] [place]. [place] [address] [phone]       OFFICE CONSULT NOTE  Mr. TAEGEN DELKER Date of Birth:  09/18/1945 Medical Record Number:  [id number]   Referring MD: Arlin Krebs  Reason for Referral: Stroke  HPI: Brad Kirk is a 78 year old pleasant male seen today for initial office consultation visit for stroke.  History is obtained from the patient and review of electronic medical records.  I personally reviewed pertinent available imaging films in PACS.  He has past medical history significant only for hypertension.  He presented on 8/38/25 with sudden onset of left-sided weakness.  He was watching TV and suddenly noticed left side was weak.  Please about similar symptoms 2 weeks ago which was transient and resolved and did not seek medical help.  NIH stroke scale was 3.  CT head showed subtle loss of gray-white differentiation involving high right frontoparietal region concerning for evolving ACA territory infarct.  Aspect score of 8.  CT angiogram of the head and neck showed a intraluminal filling defect in the right ICA terminus suspicious for thrombus with severe high-grade stenosis.  Moderate atheromatous changes are noted in the left supraclinoid ICA as well.  CT perfusion showed a 4 cc of core infarct in the right parietal convexity but no significant surrounding penumbra.  MRI scan confirmed acute infarct involving right parietal cortex and right frontal parietal border zone.  Echocardiogram showed ejection fraction of 60 to 65%.  Left atrial size was normal.  LDL cholesterol was elevated percent.  Hemoglobin A1c was 4.9.  Urine drug screen was positive for marijuana.  Patient was started on antiplatelet therapy on Lipitor.  Patient states he has done well since discharge.  Left-sided weakness and numbness has mostly improved.  He still has some diminished fine motor skills in the left hand and left upper extremity.  He is tolerating aspirin   and Plavix  well with minor bruising.  He has been eating healthy and working out regularly loss of weight.  He has quit using marijuana.  He does not smoke.  He has had no recurrent stroke or TIA symptoms.  He denies any prior history of palpitations, syncopal episodes or cardiac problems  ROS:   14 system review of systems is positive for weakness, bruising all other systems negative  PMH:  Past Medical History:  Diagnosis Date   Hypertension     Social History:  Social History   Socioeconomic History   Marital status: Single    Spouse name: Not on file   Number of children: Not on file   Years of education: Not on file   Highest education level: Not on file  Occupational History   Not on file  Tobacco Use   Smoking status: Never   Smokeless tobacco: Never  Substance and Sexual Activity   Alcohol use: Not Currently   Drug use: Yes    Types: Marijuana   Sexual activity: Not on file  Other Topics Concern   Not on file  Social History Narrative   Not on file   Social Drivers of Health   Financial Resource Strain: Not on file  Food Insecurity: No Food Insecurity (11/13/2023)   Hunger Vital Sign    Worried About Running Out of Food in the Last Year: Never true    Ran Out of Food in the Last Year: Never true  Transportation Needs: No Transportation Needs (11/13/2023)   PRAPARE - Administrator, Civil Service (Medical):  No    Lack of Transportation (Non-Medical): No  Physical Activity: Not on file  Stress: Not on file  Social Connections: Unknown (11/13/2023)   Social Connection and Isolation Panel    Frequency of Communication with Friends and Family: More than three times a week    Frequency of Social Gatherings with Friends and Family: More than three times a week    Attends Religious Services: Patient declined    Database Administrator or Organizations: Patient declined    Attends Banker Meetings: Patient declined    Marital Status: Patient  declined  Intimate Partner Violence: Not At Risk (11/13/2023)   Humiliation, Afraid, Rape, and Kick questionnaire    Fear of Current or Ex-Partner: No    Emotionally Abused: No    Physically Abused: No    Sexually Abused: No    Medications:   Current Outpatient Medications on File Prior to Visit  Medication Sig Dispense Refill   aspirin  81 MG chewable tablet Chew 1 tablet (81 mg total) by mouth daily. 30 tablet 11   atorvastatin  (LIPITOR) 40 MG tablet Take 1 tablet (40 mg total) by mouth daily. 30 tablet 2   clopidogrel  (PLAVIX ) 75 MG tablet Take 1 tablet (75 mg total) by mouth daily. 90 tablet 0   losartan  ([name] ) 25 MG tablet Take 0.5 tablets (12.5 mg total) by mouth daily. 30 tablet 3   pantoprazole  (PROTONIX ) 40 MG tablet Take 1 tablet (40 mg total) by mouth daily. 30 tablet 1   No current facility-administered medications on file prior to visit.    Allergies:  No Known Allergies  Physical Exam General: well developed, well nourished, seated, in no evident distress Head: head normocephalic and atraumatic.   Neck: supple with no carotid or supraclavicular bruits Cardiovascular: regular rate and rhythm, no murmurs Musculoskeletal: no deformity Skin:  no rash/petichiae Vascular:  Normal pulses all extremities  Neurologic Exam Mental Status: Awake and fully alert. Oriented to place and time. Recent and remote memory intact. Attention span, concentration and fund of knowledge appropriate. Mood and affect appropriate.  MMSE 25/30.  Clock drawing 4/4.  Animal naming 9 Cranial Nerves: Fundoscopic exam reveals sharp disc margins. Pupils equal, briskly reactive to light. Extraocular movements full without nystagmus. Visual fields full to confrontation. Hearing intact. Facial sensation intact. Face, tongue, palate moves normally and symmetrically.  Motor: Normal bulk and tone. Normal strength in all tested extremity muscles.  Diminished fine finger movements on the left.  Moderate right  over left upper extremity. Sensory.: intact to touch , pinprick , position and vibratory sensation.  Coordination: Rapid alternating movements normal in all extremities. Finger-to-nose and heel-to-shin performed accurately bilaterally. Gait and Station: Arises from chair without difficulty. Stance is normal. Gait demonstrates normal stride length and balance . Able to heel, toe and tandem walk without difficulty.  Reflexes: 1+ and symmetric. Toes downgoing.   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  Vascular risk factors of intracranial stenosis and hyperlipidemia and marijuana usage.     PLAN:I had a long d/w patient about his recent stroke, right carotid stenosis, risk for recurrent stroke/TIAs, personally independently reviewed imaging studies and stroke evaluation results and answered questions.Continue aspirin  81 mg daily and discontinue Plavix  more than 3 months and for secondary stroke prevention and maintain strict control of hypertension with blood pressure goal below 130/90, diabetes with hemoglobin A1c goal below 6.5% and lipids with LDL cholesterol goal below 70 mg/dL. I also advised the patient to eat a healthy diet with plenty of whole grains, cereals, fruits and vegetables, exercise regularly and maintain ideal body weight .check follow-up.  Check follow-up CT angiogram of the brain.  Check follow-up lipid profile.  Advised the patient to discontinue marijuana.  Check 30-day heart monitor for paroxysmal A-fib.   Followup in the future with me in 6 to 8 months or call earlier if necessary   I personally spent a total of 50 minutes in the care of the patient today including getting/reviewing separately obtained history, performing a medically appropriate exam/evaluation, counseling and educating, placing orders, referring and communicating with other health care professionals, documenting clinical information in the EHR, independently interpreting results, and coordinating care.       Eather Popp, MD  Note: This document was prepared with digital dictation and possible smart phrase technology. Any transcriptional errors that result from this process are unintentional.

## 2024-02-25 NOTE — Patient Instructions (Signed)
 I had a long d/w patient about his recent stroke, right carotid stenosis, risk for recurrent stroke/TIAs, personally independently reviewed imaging studies and stroke evaluation results and answered questions.Continue aspirin  81 mg daily and discontinue Plavix  more than 3 months and for secondary stroke prevention and maintain strict control of hypertension with blood pressure goal below 130/90, diabetes with hemoglobin A1c goal below 6.5% and lipids with LDL cholesterol goal below 70 mg/dL. I also advised the patient to eat a healthy diet with plenty of whole grains, cereals, fruits and vegetables, exercise regularly and maintain ideal body weight .check follow-up.  Check follow-up CT angiogram of the brain.  Check follow-up lipid profile.  Advised the patient to discontinue marijuana.  Check 30-day heart monitor for paroxysmal A-fib.  Followup in the future with me in 6 to 8 months or call earlier if necessary  Stroke Prevention Some medical conditions and behaviors can lead to a higher chance of having a stroke. You can help prevent a stroke by eating healthy, exercising, not smoking, and managing any medical conditions you have. Stroke is a leading cause of functional impairment. Primary prevention is particularly important because a majority of strokes are first-time events. Stroke changes the lives of not only those who experience a stroke but also their family and other caregivers. How can this condition affect me? A stroke is a medical emergency and should be treated right away. A stroke can lead to brain damage and can sometimes be life-threatening. If a person gets medical treatment right away, there is a better chance of surviving and recovering from a stroke. What can increase my risk? The following medical conditions may increase your risk of a stroke: Cardiovascular disease. High blood pressure (hypertension). Diabetes. High cholesterol. Sickle cell disease. Blood clotting disorders  (hypercoagulable state). Obesity. Sleep disorders (obstructive sleep apnea). Other risk factors include: Being older than age 56. Having a history of blood clots, stroke, or mini-stroke (transient ischemic attack, TIA). Genetic factors, such as race, ethnicity, or a family history of stroke. Smoking cigarettes or using other tobacco products. Taking birth control pills, especially if you also use tobacco. Heavy use of alcohol or drugs, especially cocaine and methamphetamine. Physical inactivity. What actions can I take to prevent this? Manage your health conditions High cholesterol levels. Eating a healthy diet is important for preventing high cholesterol. If cholesterol cannot be managed through diet alone, you may need to take medicines. Take any prescribed medicines to control your cholesterol as told by your health care provider. Hypertension. To reduce your risk of stroke, try to keep your blood pressure below 130/80. Eating a healthy diet and exercising regularly are important for controlling blood pressure. If these steps are not enough to manage your blood pressure, you may need to take medicines. Take any prescribed medicines to control hypertension as told by your health care provider. Ask your health care provider if you should monitor your blood pressure at home. Have your blood pressure checked every year, even if your blood pressure is normal. Blood pressure increases with age and some medical conditions. Diabetes. Eating a healthy diet and exercising regularly are important parts of managing your blood sugar (glucose). If your blood sugar cannot be managed through diet and exercise, you may need to take medicines. Take any prescribed medicines to control your diabetes as told by your health care provider. Get evaluated for obstructive sleep apnea. Talk to your health care provider about getting a sleep evaluation if you snore a lot or have excessive  sleepiness. Make sure that  any other medical conditions you have, such as atrial fibrillation or atherosclerosis, are managed. Nutrition Follow instructions from your health care provider about what to eat or drink to help manage your health condition. These instructions may include: Reducing your daily calorie intake. Limiting how much salt (sodium) you use to 1,500 milligrams (mg) each day. Using only healthy fats for cooking, such as olive oil, canola oil, or sunflower oil. Eating healthy foods. You can do this by: Choosing foods that are high in fiber, such as whole grains, and fresh fruits and vegetables. Eating at least 5 servings of fruits and vegetables a day. Try to fill one-half of your plate with fruits and vegetables at each meal. Choosing lean protein foods, such as lean cuts of meat, poultry without skin, fish, tofu, beans, and nuts. Eating low-fat dairy products. Avoiding foods that are high in sodium. This can help lower blood pressure. Avoiding foods that have saturated fat, trans fat, and cholesterol. This can help prevent high cholesterol. Avoiding processed and prepared foods. Counting your daily carbohydrate intake.  Lifestyle If you drink alcohol: Limit how much you have to: 0-1 drink a day for women who are not pregnant. 0-2 drinks a day for men. Know how much alcohol is in your drink. In the [place] one drink equals one 12 oz bottle of beer ( ), one 5 oz glass of wine ( ), or one 1 oz glass of hard liquor (44mL). Do not use any products that contain nicotine or tobacco. These products include cigarettes, chewing tobacco, and vaping devices, such as e-cigarettes. If you need help quitting, ask your health care provider. Avoid secondhand smoke. Do not use drugs. Activity  Try to stay at a healthy weight. Get at least 30 minutes of exercise on most days, such as: Fast walking. Biking. Swimming. Medicines Take over-the-counter and prescription medicines only as told by your health  care provider. Aspirin  or blood thinners (antiplatelets or anticoagulants) may be recommended to reduce your risk of forming blood clots that can lead to stroke. Avoid taking birth control pills. Talk to your health care provider about the risks of taking birth control pills if: You are over 47 years old. You smoke. You get very bad headaches. You have had a blood clot. Where to find more information American Stroke Association: www.strokeassociation.org Get help right away if: You or a loved one has any symptoms of a stroke. BE FAST is an easy way to remember the main warning signs of a stroke: B - Balance. Signs are dizziness, sudden trouble walking, or loss of balance. E - Eyes. Signs are trouble seeing or a sudden change in vision. F - Face. Signs are sudden weakness or numbness of the face, or the face or eyelid drooping on one side. A - Arms. Signs are weakness or numbness in an arm. This happens suddenly and usually on one side of the body. S - Speech. Signs are sudden trouble speaking, slurred speech, or trouble understanding what people say. T - Time. Time to call emergency services. Write down what time symptoms started. You or a loved one has other signs of a stroke, such as: A sudden, severe headache with no known cause. Nausea or vomiting. Seizure. These symptoms may represent a serious problem that is an emergency. Do not wait to see if the symptoms will go away. Get medical help right away. Call your local emergency services (911 in the [place] Do not drive yourself to the hospital. Summary  You can help to prevent a stroke by eating healthy, exercising, not smoking, limiting alcohol intake, and managing any medical conditions you may have. Do not use any products that contain nicotine or tobacco. These include cigarettes, chewing tobacco, and vaping devices, such as e-cigarettes. If you need help quitting, ask your health care provider. Remember BE FAST for warning signs of  a stroke. Get help right away if you or a loved one has any of these signs. This information is not intended to replace advice given to you by your health care provider. Make sure you discuss any questions you have with your health care provider. Document Revised: 02/18/2022 Document Reviewed: 02/18/2022 Elsevier Patient Education  2024 [employer].

## 2024-02-25 NOTE — Telephone Encounter (Signed)
 no auth required sent to [facility] [phone]

## 2024-02-26 LAB — LIPID PANEL
Chol/HDL Ratio: 4.3 ratio (ref 0.0–5.0)
Cholesterol, Total: 213 mg/dL — ABNORMAL HIGH (ref 100–199)
HDL: 50 mg/dL (ref >39)
LDL Chol Calc (NIH): 152 mg/dL — ABNORMAL HIGH (ref 0–99)
Triglycerides: 63 mg/dL (ref 0–149)
VLDL Cholesterol Cal: 11 mg/dL (ref 5–40)

## 2024-02-29 ENCOUNTER — Other Ambulatory Visit: Payer: Self-pay | Admitting: Neurology

## 2024-02-29 ENCOUNTER — Ambulatory Visit: Payer: Self-pay | Admitting: Neurology

## 2024-02-29 MED ORDER — ATORVASTATIN CALCIUM 80 MG PO TABS: 80.0000 mg | ORAL_TABLET | Freq: Every day | ORAL | 3 refills | Status: AC

## 2024-02-29 NOTE — Progress Notes (Signed)
 Kindly inform the patient that cholesterol profile is not satisfactory with the bad cholesterol being quite high.  I recommend increasing the dose of Lipitor to 80 mg daily and repeating cholesterol profile in 2 months.

## 2024-03-04 NOTE — Telephone Encounter (Addendum)
 Unable to LVM Mailbox Full  ----- Message from Nurse Heather C sent at 03/04/2024  1:59 PM EST ----- Can you call and discuss? Sethi already sent Rx to pharmacy for the Lipitor 80 mg. Schedule him for lab in 2 months for cholesterol repeat. That order is not in yet.   ----- Message ----- From: Rosemarie Eather RAMAN, MD Sent: 02/29/2024   8:46 AM EST To: [facility] 3 Results  Kindly inform the patient that cholesterol profile is not satisfactory with the bad cholesterol being quite high.  I recommend increasing the dose of Lipitor to 80 mg daily and repeating cholesterol  profile in 2 months. ----- Message ----- From: Interface, [facility] Lab Results In Sent: 02/26/2024   5:37 AM EST To: Eather RAMAN Rosemarie, MD

## 2024-03-22 ENCOUNTER — Ambulatory Visit: Admitting: Neurology

## 2024-12-28 ENCOUNTER — Ambulatory Visit: Admitting: Neurology
